# Patient Record
Sex: Male | Born: 1935 | Marital: Single
Health system: Southern US, Community
[De-identification: ages and names within clinical notes are randomized; demographics above are authoritative.]

---

## 2006-02-23 ENCOUNTER — Other Ambulatory Visit: Payer: Self-pay

## 2006-02-23 ENCOUNTER — Inpatient Hospital Stay: Payer: Self-pay | Admitting: Internal Medicine

## 2006-06-12 ENCOUNTER — Ambulatory Visit: Payer: Self-pay | Admitting: Internal Medicine

## 2006-06-18 ENCOUNTER — Ambulatory Visit: Payer: Self-pay | Admitting: Internal Medicine

## 2006-06-23 ENCOUNTER — Ambulatory Visit: Payer: Self-pay | Admitting: Internal Medicine

## 2007-01-01 ENCOUNTER — Encounter: Payer: Self-pay | Admitting: Internal Medicine

## 2007-02-02 ENCOUNTER — Ambulatory Visit: Payer: Self-pay | Admitting: Vascular Surgery

## 2007-02-13 ENCOUNTER — Encounter: Payer: Self-pay | Admitting: Internal Medicine

## 2007-02-17 ENCOUNTER — Encounter: Payer: Self-pay | Admitting: Internal Medicine

## 2007-03-20 ENCOUNTER — Encounter: Payer: Self-pay | Admitting: Internal Medicine

## 2007-04-18 ENCOUNTER — Ambulatory Visit: Payer: Self-pay | Admitting: Internal Medicine

## 2007-04-20 ENCOUNTER — Encounter: Payer: Self-pay | Admitting: Internal Medicine

## 2009-04-19 ENCOUNTER — Ambulatory Visit: Payer: Self-pay | Admitting: Urology

## 2011-08-19 ENCOUNTER — Inpatient Hospital Stay: Payer: Self-pay | Admitting: Internal Medicine

## 2011-08-20 LAB — CBC WITH DIFFERENTIAL/PLATELET
Basophil #: 0 10*3/uL (ref 0.0–0.1)
Basophil %: 0 %
Eosinophil %: 0.4 %
HCT: 30.4 % — ABNORMAL LOW (ref 40.0–52.0)
Lymphocyte #: 2.1 10*3/uL (ref 1.0–3.6)
Lymphocyte %: 10.3 %
MCH: 28.5 pg (ref 26.0–34.0)
MCV: 88 fL (ref 80–100)
Monocyte %: 7.2 %
Neutrophil #: 16.7 10*3/uL — ABNORMAL HIGH (ref 1.4–6.5)
Neutrophil %: 82.1 %
RBC: 3.46 10*6/uL — ABNORMAL LOW (ref 4.40–5.90)
RDW: 17.3 % — ABNORMAL HIGH (ref 11.5–14.5)
WBC: 20.3 10*3/uL — ABNORMAL HIGH (ref 3.8–10.6)

## 2011-08-20 LAB — LIPID PANEL
Cholesterol: 146 mg/dL (ref 0–200)
HDL Cholesterol: 63 mg/dL — ABNORMAL HIGH (ref 40–60)
Ldl Cholesterol, Calc: 61 mg/dL (ref 0–100)
VLDL Cholesterol, Calc: 22 mg/dL (ref 5–40)

## 2011-08-20 LAB — BASIC METABOLIC PANEL
Anion Gap: 10 (ref 7–16)
Chloride: 99 mmol/L (ref 98–107)
Co2: 28 mmol/L (ref 21–32)
Creatinine: 1.46 mg/dL — ABNORMAL HIGH (ref 0.60–1.30)
EGFR (Non-African Amer.): 50 — ABNORMAL LOW

## 2011-08-21 LAB — CBC WITH DIFFERENTIAL/PLATELET
Basophil %: 0 %
Eosinophil #: 0.2 10*3/uL (ref 0.0–0.7)
Eosinophil %: 1.5 %
HGB: 10.3 g/dL — ABNORMAL LOW (ref 13.0–18.0)
Lymphocyte %: 10.4 %
MCHC: 32.9 g/dL (ref 32.0–36.0)
Monocyte %: 5.3 %
Neutrophil #: 11.9 10*3/uL — ABNORMAL HIGH (ref 1.4–6.5)
Neutrophil %: 82.8 %
Platelet: 179 10*3/uL (ref 150–440)
RBC: 3.59 10*6/uL — ABNORMAL LOW (ref 4.40–5.90)

## 2011-08-21 LAB — BASIC METABOLIC PANEL
Anion Gap: 10 (ref 7–16)
BUN: 19 mg/dL — ABNORMAL HIGH (ref 7–18)
Calcium, Total: 8.6 mg/dL (ref 8.5–10.1)
Chloride: 102 mmol/L (ref 98–107)
Co2: 26 mmol/L (ref 21–32)
EGFR (African American): >60

## 2011-08-22 LAB — CREATININE, SERUM
EGFR (African American): >60
EGFR (Non-African Amer.): >60

## 2011-08-22 LAB — PROTIME-INR
INR: 1.2
Prothrombin Time: 15.2 secs — ABNORMAL HIGH (ref 11.5–14.7)

## 2012-07-23 ENCOUNTER — Ambulatory Visit: Payer: Self-pay | Admitting: Unknown Physician Specialty

## 2012-08-15 ENCOUNTER — Other Ambulatory Visit: Payer: Self-pay | Admitting: Internal Medicine

## 2012-12-05 ENCOUNTER — Ambulatory Visit: Payer: Self-pay | Admitting: Internal Medicine

## 2012-12-05 LAB — CBC WITH DIFFERENTIAL/PLATELET
Basophil #: 0.1 10*3/uL (ref 0.0–0.1)
HCT: 36.2 % — ABNORMAL LOW (ref 40.0–52.0)
MCH: 27.5 pg (ref 26.0–34.0)
MCHC: 32.2 g/dL (ref 32.0–36.0)
MCV: 86 fL (ref 80–100)
Monocyte #: 1.2 x10 3/mm — ABNORMAL HIGH (ref 0.2–1.0)
Monocyte %: 5.6 %
Neutrophil #: 18 10*3/uL — ABNORMAL HIGH (ref 1.4–6.5)
RBC: 4.24 10*6/uL — ABNORMAL LOW (ref 4.40–5.90)
RDW: 17.9 % — ABNORMAL HIGH (ref 11.5–14.5)

## 2012-12-05 LAB — COMPREHENSIVE METABOLIC PANEL
Albumin: 3 g/dL — ABNORMAL LOW (ref 3.4–5.0)
BUN: 21 mg/dL — ABNORMAL HIGH (ref 7–18)
Bilirubin,Total: 0.3 mg/dL (ref 0.2–1.0)
Calcium, Total: 9.5 mg/dL (ref 8.5–10.1)
Chloride: 101 mmol/L (ref 98–107)
Co2: 29 mmol/L (ref 21–32)
Creatinine: 1.18 mg/dL (ref 0.60–1.30)
EGFR (African American): >60
Glucose: 217 mg/dL — ABNORMAL HIGH (ref 65–99)
Potassium: 4.5 mmol/L (ref 3.5–5.1)
SGOT(AST): 23 U/L (ref 15–37)
SGPT (ALT): 24 U/L (ref 12–78)
Sodium: 136 mmol/L (ref 136–145)

## 2013-09-08 ENCOUNTER — Inpatient Hospital Stay: Payer: Self-pay | Admitting: Internal Medicine

## 2013-09-08 LAB — CBC WITH DIFFERENTIAL/PLATELET
BASOS ABS: 0.1 10*3/uL (ref 0.0–0.1)
Basophil %: 0.4 %
Eosinophil #: 0.1 10*3/uL (ref 0.0–0.7)
Eosinophil %: 0.2 %
HCT: 32.9 % — AB (ref 40.0–52.0)
HGB: 10.6 g/dL — AB (ref 13.0–18.0)
Lymphocyte #: 2 10*3/uL (ref 1.0–3.6)
Lymphocyte %: 6.9 %
MCH: 29.7 pg (ref 26.0–34.0)
MCHC: 32.3 g/dL (ref 32.0–36.0)
MCV: 92 fL (ref 80–100)
MONOS PCT: 6.8 %
Monocyte #: 1.9 x10 3/mm — ABNORMAL HIGH (ref 0.2–1.0)
Neutrophil #: 24.6 10*3/uL — ABNORMAL HIGH (ref 1.4–6.5)
Neutrophil %: 85.7 %
Platelet: 161 10*3/uL (ref 150–440)
RBC: 3.59 10*6/uL — ABNORMAL LOW (ref 4.40–5.90)
RDW: 15.6 % — ABNORMAL HIGH (ref 11.5–14.5)
WBC: 28.7 10*3/uL — ABNORMAL HIGH (ref 3.8–10.6)

## 2013-09-08 LAB — URINALYSIS, COMPLETE
Bilirubin,UR: NEGATIVE
Glucose,UR: NEGATIVE mg/dL (ref 0–75)
Nitrite: NEGATIVE
PH: 5 (ref 4.5–8.0)
Protein: 30
RBC,UR: 20 /HPF (ref 0–5)
SPECIFIC GRAVITY: 1.019 (ref 1.003–1.030)
WBC UR: 108 /HPF (ref 0–5)

## 2013-09-08 LAB — TROPONIN I
Troponin-I: 0.9 ng/mL — ABNORMAL HIGH
Troponin-I: 0.09 ng/mL — ABNORMAL HIGH

## 2013-09-08 LAB — CK-MB
CK-MB: 0.8 ng/mL (ref 0.5–3.6)
CK-MB: 3.2 ng/mL (ref 0.5–3.6)

## 2013-09-08 LAB — COMPREHENSIVE METABOLIC PANEL
ALBUMIN: 1.8 g/dL — AB (ref 3.4–5.0)
ALT: 22 U/L (ref 12–78)
Alkaline Phosphatase: 154 U/L — ABNORMAL HIGH
Anion Gap: 12 (ref 7–16)
BUN: 90 mg/dL — ABNORMAL HIGH (ref 7–18)
Bilirubin,Total: 0.5 mg/dL (ref 0.2–1.0)
CALCIUM: 8.4 mg/dL — AB (ref 8.5–10.1)
CHLORIDE: 105 mmol/L (ref 98–107)
Co2: 21 mmol/L (ref 21–32)
Creatinine: 6.05 mg/dL — ABNORMAL HIGH (ref 0.60–1.30)
GFR CALC AF AMER: 10 — AB
GFR CALC NON AF AMER: 8 — AB
GLUCOSE: 124 mg/dL — AB (ref 65–99)
OSMOLALITY: 305 (ref 275–301)
Potassium: 4.4 mmol/L (ref 3.5–5.1)
SGOT(AST): 27 U/L (ref 15–37)
Sodium: 138 mmol/L (ref 136–145)
Total Protein: 6.9 g/dL (ref 6.4–8.2)

## 2013-09-08 LAB — PROTIME-INR
INR: 1.3
Prothrombin Time: 15.6 secs — ABNORMAL HIGH (ref 11.5–14.7)

## 2013-09-08 LAB — MAGNESIUM: Magnesium: 2.8 mg/dL — ABNORMAL HIGH

## 2013-09-08 LAB — LIPASE, BLOOD: Lipase: 90 U/L (ref 73–393)

## 2013-09-08 LAB — APTT: Activated PTT: 28.8 secs (ref 23.6–35.9)

## 2013-09-09 DIAGNOSIS — E785 Hyperlipidemia, unspecified: Secondary | ICD-10-CM

## 2013-09-09 DIAGNOSIS — R7989 Other specified abnormal findings of blood chemistry: Secondary | ICD-10-CM

## 2013-09-09 DIAGNOSIS — R6520 Severe sepsis without septic shock: Secondary | ICD-10-CM

## 2013-09-09 DIAGNOSIS — A419 Sepsis, unspecified organism: Secondary | ICD-10-CM

## 2013-09-09 DIAGNOSIS — N179 Acute kidney failure, unspecified: Secondary | ICD-10-CM

## 2013-09-09 DIAGNOSIS — I10 Essential (primary) hypertension: Secondary | ICD-10-CM

## 2013-09-09 LAB — PHENYTOIN LEVEL, TOTAL: Dilantin: 12.5 ug/mL (ref 10.0–20.0)

## 2013-09-09 LAB — COMPREHENSIVE METABOLIC PANEL
ALT: 17 U/L (ref 12–78)
AST: 22 U/L (ref 15–37)
Albumin: 1.8 g/dL — ABNORMAL LOW (ref 3.4–5.0)
Alkaline Phosphatase: 126 U/L — ABNORMAL HIGH
Anion Gap: 9 (ref 7–16)
BILIRUBIN TOTAL: 0.4 mg/dL (ref 0.2–1.0)
BUN: 80 mg/dL — AB (ref 7–18)
CO2: 21 mmol/L (ref 21–32)
CREATININE: 5.11 mg/dL — AB (ref 0.60–1.30)
Calcium, Total: 7.7 mg/dL — ABNORMAL LOW (ref 8.5–10.1)
Chloride: 109 mmol/L — ABNORMAL HIGH (ref 98–107)
GFR CALC AF AMER: 12 — AB
GFR CALC NON AF AMER: 10 — AB
GLUCOSE: 154 mg/dL — AB (ref 65–99)
OSMOLALITY: 305 (ref 275–301)
Potassium: 3.5 mmol/L (ref 3.5–5.1)
SODIUM: 139 mmol/L (ref 136–145)
TOTAL PROTEIN: 6.5 g/dL (ref 6.4–8.2)

## 2013-09-09 LAB — CBC WITH DIFFERENTIAL/PLATELET
Basophil #: 0.1 10*3/uL (ref 0.0–0.1)
Basophil %: 0.4 %
EOS ABS: 0.2 10*3/uL (ref 0.0–0.7)
EOS PCT: 0.8 %
HCT: 33.5 % — AB (ref 40.0–52.0)
HGB: 10.8 g/dL — ABNORMAL LOW (ref 13.0–18.0)
LYMPHS ABS: 2 10*3/uL (ref 1.0–3.6)
LYMPHS PCT: 8.7 %
MCH: 29.8 pg (ref 26.0–34.0)
MCHC: 32.1 g/dL (ref 32.0–36.0)
MCV: 93 fL (ref 80–100)
MONOS PCT: 7.2 %
Monocyte #: 1.6 x10 3/mm — ABNORMAL HIGH (ref 0.2–1.0)
Neutrophil #: 18.8 10*3/uL — ABNORMAL HIGH (ref 1.4–6.5)
Neutrophil %: 82.9 %
Platelet: 162 10*3/uL (ref 150–440)
RBC: 3.62 10*6/uL — ABNORMAL LOW (ref 4.40–5.90)
RDW: 16.4 % — AB (ref 11.5–14.5)
WBC: 22.7 10*3/uL — AB (ref 3.8–10.6)

## 2013-09-09 LAB — HEMOGLOBIN A1C: HEMOGLOBIN A1C: 9.1 % — AB (ref 4.2–6.3)

## 2013-09-09 LAB — MAGNESIUM: Magnesium: 2.2 mg/dL

## 2013-09-09 LAB — TROPONIN I: Troponin-I: 0.11 ng/mL — ABNORMAL HIGH

## 2013-09-09 LAB — CK-MB: CK-MB: 3.9 ng/mL — ABNORMAL HIGH (ref 0.5–3.6)

## 2013-09-09 LAB — VANCOMYCIN, RANDOM: Vancomycin, Random: 4 ug/mL

## 2013-09-09 LAB — TSH: Thyroid Stimulating Horm: 2.28 u[IU]/mL

## 2013-09-10 DIAGNOSIS — I219 Acute myocardial infarction, unspecified: Secondary | ICD-10-CM

## 2013-09-10 LAB — CBC WITH DIFFERENTIAL/PLATELET
Basophil #: 0 10*3/uL (ref 0.0–0.1)
Basophil %: 0.1 %
EOS PCT: 0.1 %
Eosinophil #: 0 10*3/uL (ref 0.0–0.7)
HCT: 32 % — ABNORMAL LOW (ref 40.0–52.0)
HGB: 10.1 g/dL — ABNORMAL LOW (ref 13.0–18.0)
LYMPHS ABS: 1.7 10*3/uL (ref 1.0–3.6)
LYMPHS PCT: 5 %
MCH: 29.6 pg (ref 26.0–34.0)
MCHC: 31.7 g/dL — ABNORMAL LOW (ref 32.0–36.0)
MCV: 93 fL (ref 80–100)
MONO ABS: 2 x10 3/mm — AB (ref 0.2–1.0)
MONOS PCT: 6 %
Neutrophil #: 30 10*3/uL — ABNORMAL HIGH (ref 1.4–6.5)
Neutrophil %: 88.8 %
PLATELETS: 140 10*3/uL — AB (ref 150–440)
RBC: 3.43 10*6/uL — ABNORMAL LOW (ref 4.40–5.90)
RDW: 17 % — ABNORMAL HIGH (ref 11.5–14.5)
WBC: 33.8 10*3/uL — AB (ref 3.8–10.6)

## 2013-09-10 LAB — BASIC METABOLIC PANEL
ANION GAP: 10 (ref 7–16)
Anion Gap: 15 (ref 7–16)
BUN: 77 mg/dL — AB (ref 7–18)
BUN: 75 mg/dL — ABNORMAL HIGH (ref 7–18)
CALCIUM: 7.5 mg/dL — AB (ref 8.5–10.1)
CALCIUM: 7.4 mg/dL — AB (ref 8.5–10.1)
CHLORIDE: 108 mmol/L — AB (ref 98–107)
CREATININE: 5.39 mg/dL — AB (ref 0.60–1.30)
Chloride: 111 mmol/L — ABNORMAL HIGH (ref 98–107)
Co2: 15 mmol/L — ABNORMAL LOW (ref 21–32)
Co2: 19 mmol/L — ABNORMAL LOW (ref 21–32)
Creatinine: 5.35 mg/dL — ABNORMAL HIGH (ref 0.60–1.30)
EGFR (African American): 11 — ABNORMAL LOW
EGFR (African American): 11 — ABNORMAL LOW
EGFR (Non-African Amer.): 9 — ABNORMAL LOW
GFR CALC NON AF AMER: 10 — AB
Glucose: 357 mg/dL — ABNORMAL HIGH (ref 65–99)
Glucose: 285 mg/dL — ABNORMAL HIGH (ref 65–99)
OSMOLALITY: 313 (ref 275–301)
OSMOLALITY: 312 (ref 275–301)
Potassium: 3.8 mmol/L (ref 3.5–5.1)
Potassium: 3.4 mmol/L — ABNORMAL LOW (ref 3.5–5.1)
SODIUM: 138 mmol/L (ref 136–145)
Sodium: 140 mmol/L (ref 136–145)

## 2013-09-10 LAB — PHENYTOIN LEVEL, TOTAL: DILANTIN: 11.2 ug/mL (ref 10.0–20.0)

## 2013-09-10 LAB — URINE CULTURE

## 2013-09-10 LAB — MAGNESIUM: MAGNESIUM: 2.2 mg/dL

## 2013-09-11 ENCOUNTER — Ambulatory Visit: Payer: Self-pay | Admitting: Urology

## 2013-09-11 LAB — CBC WITH DIFFERENTIAL/PLATELET
BASOS ABS: 0.1 10*3/uL (ref 0.0–0.1)
BASOS PCT: 0.2 %
EOS ABS: 0.2 10*3/uL (ref 0.0–0.7)
EOS PCT: 0.7 %
HCT: 30.2 % — ABNORMAL LOW (ref 40.0–52.0)
HGB: 9.7 g/dL — ABNORMAL LOW (ref 13.0–18.0)
Lymphocyte #: 1.4 10*3/uL (ref 1.0–3.6)
Lymphocyte %: 3.9 %
MCH: 29.3 pg (ref 26.0–34.0)
MCHC: 32 g/dL (ref 32.0–36.0)
MCV: 92 fL (ref 80–100)
MONOS PCT: 4.8 %
Monocyte #: 1.7 x10 3/mm — ABNORMAL HIGH (ref 0.2–1.0)
NEUTROS PCT: 90.4 %
Neutrophil #: 32.2 10*3/uL — ABNORMAL HIGH (ref 1.4–6.5)
PLATELETS: 132 10*3/uL — AB (ref 150–440)
RBC: 3.31 10*6/uL — ABNORMAL LOW (ref 4.40–5.90)
RDW: 17 % — AB (ref 11.5–14.5)
WBC: 35.5 10*3/uL — ABNORMAL HIGH (ref 3.8–10.6)

## 2013-09-11 LAB — BASIC METABOLIC PANEL
Anion Gap: 10 (ref 7–16)
BUN: 72 mg/dL — AB (ref 7–18)
CALCIUM: 7.2 mg/dL — AB (ref 8.5–10.1)
CO2: 18 mmol/L — AB (ref 21–32)
Chloride: 112 mmol/L — ABNORMAL HIGH (ref 98–107)
Creatinine: 5.27 mg/dL — ABNORMAL HIGH (ref 0.60–1.30)
EGFR (African American): 11 — ABNORMAL LOW
GFR CALC NON AF AMER: 10 — AB
GLUCOSE: 195 mg/dL — AB (ref 65–99)
OSMOLALITY: 306 (ref 275–301)
POTASSIUM: 3.7 mmol/L (ref 3.5–5.1)
SODIUM: 140 mmol/L (ref 136–145)

## 2013-09-11 LAB — MAGNESIUM: Magnesium: 1.8 mg/dL

## 2013-09-12 LAB — BASIC METABOLIC PANEL
ANION GAP: 14 (ref 7–16)
BUN: 66 mg/dL — ABNORMAL HIGH (ref 7–18)
CALCIUM: 7.5 mg/dL — AB (ref 8.5–10.1)
Chloride: 118 mmol/L — ABNORMAL HIGH (ref 98–107)
Co2: 13 mmol/L — ABNORMAL LOW (ref 21–32)
Creatinine: 4.52 mg/dL — ABNORMAL HIGH (ref 0.60–1.30)
EGFR (African American): 14 — ABNORMAL LOW
EGFR (Non-African Amer.): 12 — ABNORMAL LOW
GLUCOSE: 172 mg/dL — AB (ref 65–99)
OSMOLALITY: 312 (ref 275–301)
POTASSIUM: 3.5 mmol/L (ref 3.5–5.1)
SODIUM: 145 mmol/L (ref 136–145)

## 2013-09-12 LAB — WBC: WBC: 19.1 10*3/uL — ABNORMAL HIGH (ref 3.8–10.6)

## 2013-09-13 ENCOUNTER — Encounter: Payer: Self-pay | Admitting: Cardiovascular Disease

## 2013-09-13 LAB — BASIC METABOLIC PANEL
ANION GAP: 13 (ref 7–16)
BUN: 55 mg/dL — AB (ref 7–18)
Calcium, Total: 7.2 mg/dL — ABNORMAL LOW (ref 8.5–10.1)
Chloride: 113 mmol/L — ABNORMAL HIGH (ref 98–107)
Co2: 20 mmol/L — ABNORMAL LOW (ref 21–32)
Creatinine: 3.67 mg/dL — ABNORMAL HIGH (ref 0.60–1.30)
EGFR (African American): 18 — ABNORMAL LOW
GFR CALC NON AF AMER: 15 — AB
Glucose: 236 mg/dL — ABNORMAL HIGH (ref 65–99)
Osmolality: 313 (ref 275–301)
Potassium: 3 mmol/L — ABNORMAL LOW (ref 3.5–5.1)
SODIUM: 146 mmol/L — AB (ref 136–145)

## 2013-09-13 LAB — CULTURE, BLOOD (SINGLE)

## 2013-09-14 LAB — BASIC METABOLIC PANEL
ANION GAP: 9 (ref 7–16)
BUN: 42 mg/dL — ABNORMAL HIGH (ref 7–18)
CHLORIDE: 109 mmol/L — AB (ref 98–107)
Calcium, Total: 7.2 mg/dL — ABNORMAL LOW (ref 8.5–10.1)
Co2: 28 mmol/L (ref 21–32)
Creatinine: 3.12 mg/dL — ABNORMAL HIGH (ref 0.60–1.30)
EGFR (Non-African Amer.): 18 — ABNORMAL LOW
GFR CALC AF AMER: 21 — AB
GLUCOSE: 266 mg/dL — AB (ref 65–99)
Osmolality: 310 (ref 275–301)
POTASSIUM: 2.7 mmol/L — AB (ref 3.5–5.1)
Sodium: 146 mmol/L — ABNORMAL HIGH (ref 136–145)

## 2013-09-14 LAB — CREATININE, SERUM
CREATININE: 3.26 mg/dL — AB (ref 0.60–1.30)
EGFR (African American): 20 — ABNORMAL LOW
EGFR (Non-African Amer.): 17 — ABNORMAL LOW

## 2013-09-14 LAB — PLATELET COUNT: Platelet: 197 10*3/uL (ref 150–440)

## 2013-09-15 LAB — POTASSIUM: POTASSIUM: 3.3 mmol/L — AB (ref 3.5–5.1)

## 2013-09-16 LAB — BODY FLUID CULTURE

## 2013-09-18 ENCOUNTER — Inpatient Hospital Stay: Payer: Self-pay | Admitting: Family Medicine

## 2013-09-18 LAB — URINALYSIS, COMPLETE
Bilirubin,UR: NEGATIVE
Glucose,UR: 50 mg/dL (ref 0–75)
KETONE: NEGATIVE
Nitrite: NEGATIVE
Ph: 8 (ref 4.5–8.0)
Protein: 100
RBC,UR: 5045 /HPF (ref 0–5)
SPECIFIC GRAVITY: 1.013 (ref 1.003–1.030)
SQUAMOUS EPITHELIAL: NONE SEEN
WBC UR: 121 /HPF (ref 0–5)

## 2013-09-18 LAB — OCCULT BLOOD X 1 CARD TO LAB, STOOL: Occult Blood, Feces: POSITIVE

## 2013-09-18 LAB — COMPREHENSIVE METABOLIC PANEL
ALBUMIN: 1.7 g/dL — AB (ref 3.4–5.0)
ALK PHOS: 149 U/L — AB
ANION GAP: 6 — AB (ref 7–16)
BUN: 36 mg/dL — AB (ref 7–18)
Bilirubin,Total: 0.2 mg/dL (ref 0.2–1.0)
CALCIUM: 7.9 mg/dL — AB (ref 8.5–10.1)
CHLORIDE: 121 mmol/L — AB (ref 98–107)
Co2: 28 mmol/L (ref 21–32)
Creatinine: 2.52 mg/dL — ABNORMAL HIGH (ref 0.60–1.30)
EGFR (African American): 28 — ABNORMAL LOW
EGFR (Non-African Amer.): 24 — ABNORMAL LOW
GLUCOSE: 221 mg/dL — AB (ref 65–99)
OSMOLALITY: 322 (ref 275–301)
Potassium: 3.3 mmol/L — ABNORMAL LOW (ref 3.5–5.1)
SGOT(AST): 22 U/L (ref 15–37)
SGPT (ALT): 16 U/L (ref 12–78)
SODIUM: 155 mmol/L — AB (ref 136–145)
TOTAL PROTEIN: 7 g/dL (ref 6.4–8.2)

## 2013-09-18 LAB — CBC
HCT: 33.7 % — ABNORMAL LOW (ref 40.0–52.0)
HGB: 10.9 g/dL — AB (ref 13.0–18.0)
MCH: 29.6 pg (ref 26.0–34.0)
MCHC: 32.5 g/dL (ref 32.0–36.0)
MCV: 91 fL (ref 80–100)
Platelet: 279 10*3/uL (ref 150–440)
RBC: 3.69 10*6/uL — ABNORMAL LOW (ref 4.40–5.90)
RDW: 17.1 % — AB (ref 11.5–14.5)
WBC: 22.9 10*3/uL — ABNORMAL HIGH (ref 3.8–10.6)

## 2013-09-18 LAB — APTT: Activated PTT: 39.7 secs — ABNORMAL HIGH (ref 23.6–35.9)

## 2013-09-18 LAB — TROPONIN I: Troponin-I: <0.02 ng/mL

## 2013-09-18 LAB — PHENYTOIN LEVEL, TOTAL: DILANTIN: 21.8 ug/mL — AB (ref 10.0–20.0)

## 2013-09-18 LAB — PROTIME-INR
INR: 1.2
PROTHROMBIN TIME: 15.2 s — AB (ref 11.5–14.7)

## 2013-09-18 LAB — CLOSTRIDIUM DIFFICILE(ARMC)

## 2013-09-18 LAB — PHENOBARBITAL LEVEL: PHENOBARBITAL: 24.2 ug/mL (ref 15.0–40.0)

## 2013-09-19 LAB — TSH: Thyroid Stimulating Horm: 7 u[IU]/mL — ABNORMAL HIGH

## 2013-09-19 LAB — WBCS, STOOL

## 2013-09-19 LAB — CBC WITH DIFFERENTIAL/PLATELET
Basophil #: 0.1 x10 3/mm 3
Basophil %: 0.6 %
Eosinophil #: 0.2 x10 3/mm 3
Eosinophil %: 1.8 %
HCT: 30 % — ABNORMAL LOW
HGB: 9.8 g/dL — ABNORMAL LOW
Lymphocyte %: 16.6 %
Lymphs Abs: 2 x10 3/mm 3
MCH: 29.8 pg
MCHC: 32.8 g/dL
MCV: 91 fL
Monocyte #: 0.7 "x10 3/mm "
Monocyte %: 5.4 %
Neutrophil #: 9.2 x10 3/mm 3 — ABNORMAL HIGH
Neutrophil %: 75.6 %
Platelet: 256 x10 3/mm 3
RBC: 3.3 x10 6/mm 3 — ABNORMAL LOW
RDW: 17 % — ABNORMAL HIGH
WBC: 12.2 x10 3/mm 3 — ABNORMAL HIGH

## 2013-09-19 LAB — BASIC METABOLIC PANEL
Anion Gap: 5 — ABNORMAL LOW (ref 7–16)
BUN: 30 mg/dL — ABNORMAL HIGH (ref 7–18)
CHLORIDE: 122 mmol/L — AB (ref 98–107)
CO2: 29 mmol/L (ref 21–32)
Calcium, Total: 7.7 mg/dL — ABNORMAL LOW (ref 8.5–10.1)
Creatinine: 2.36 mg/dL — ABNORMAL HIGH (ref 0.60–1.30)
GFR CALC AF AMER: 30 — AB
GFR CALC NON AF AMER: 26 — AB
Glucose: 110 mg/dL — ABNORMAL HIGH (ref 65–99)
Osmolality: 316 (ref 275–301)
Potassium: 3.2 mmol/L — ABNORMAL LOW (ref 3.5–5.1)
SODIUM: 156 mmol/L — AB (ref 136–145)

## 2013-09-20 LAB — CBC WITH DIFFERENTIAL/PLATELET
Basophil #: 0.1 10*3/uL (ref 0.0–0.1)
Basophil %: 0.5 %
Eosinophil #: 0.2 10*3/uL (ref 0.0–0.7)
Eosinophil %: 1.5 %
HCT: 27.6 % — AB (ref 40.0–52.0)
HGB: 8.9 g/dL — AB (ref 13.0–18.0)
LYMPHS PCT: 17.2 %
Lymphocyte #: 2 10*3/uL (ref 1.0–3.6)
MCH: 29.7 pg (ref 26.0–34.0)
MCHC: 32.2 g/dL (ref 32.0–36.0)
MCV: 92 fL (ref 80–100)
MONOS PCT: 6.1 %
Monocyte #: 0.7 x10 3/mm (ref 0.2–1.0)
NEUTROS ABS: 8.7 10*3/uL — AB (ref 1.4–6.5)
NEUTROS PCT: 74.7 %
Platelet: 252 10*3/uL (ref 150–440)
RBC: 2.99 10*6/uL — AB (ref 4.40–5.90)
RDW: 17.2 % — AB (ref 11.5–14.5)
WBC: 11.7 10*3/uL — ABNORMAL HIGH (ref 3.8–10.6)

## 2013-09-20 LAB — BASIC METABOLIC PANEL
BUN: 25 mg/dL — ABNORMAL HIGH (ref 7–18)
CALCIUM: 7.9 mg/dL — AB (ref 8.5–10.1)
CREATININE: 2.43 mg/dL — AB (ref 0.60–1.30)
Co2: 26 mmol/L (ref 21–32)
EGFR (Non-African Amer.): 25 — ABNORMAL LOW
GFR CALC AF AMER: 29 — AB
Glucose: 166 mg/dL — ABNORMAL HIGH (ref 65–99)
Potassium: 4.1 mmol/L (ref 3.5–5.1)
Sodium: 157 mmol/L — ABNORMAL HIGH (ref 136–145)

## 2013-09-20 LAB — MAGNESIUM: Magnesium: 1.4 mg/dL — ABNORMAL LOW

## 2013-09-20 LAB — URINE CULTURE

## 2013-09-21 LAB — BASIC METABOLIC PANEL
BUN: 23 mg/dL — ABNORMAL HIGH (ref 7–18)
CALCIUM: 8.2 mg/dL — AB (ref 8.5–10.1)
Chloride: >128 mmol/L — ABNORMAL HIGH (ref 98–107)
Co2: 23 mmol/L (ref 21–32)
Creatinine: 2.28 mg/dL — ABNORMAL HIGH (ref 0.60–1.30)
EGFR (African American): 31 — ABNORMAL LOW
EGFR (Non-African Amer.): 27 — ABNORMAL LOW
GLUCOSE: 201 mg/dL — AB (ref 65–99)
POTASSIUM: 4.6 mmol/L (ref 3.5–5.1)

## 2013-09-21 LAB — CBC WITH DIFFERENTIAL/PLATELET
BASOS PCT: 0.6 %
Basophil #: 0.1 10*3/uL (ref 0.0–0.1)
EOS ABS: 0.2 10*3/uL (ref 0.0–0.7)
Eosinophil %: 1.1 %
HCT: 29.3 % — AB (ref 40.0–52.0)
HGB: 9.2 g/dL — ABNORMAL LOW (ref 13.0–18.0)
Lymphocyte #: 2.1 10*3/uL (ref 1.0–3.6)
Lymphocyte %: 14.1 %
MCH: 29 pg (ref 26.0–34.0)
MCHC: 31.3 g/dL — AB (ref 32.0–36.0)
MCV: 93 fL (ref 80–100)
MONO ABS: 0.8 x10 3/mm (ref 0.2–1.0)
MONOS PCT: 5 %
NEUTROS ABS: 12 10*3/uL — AB (ref 1.4–6.5)
NEUTROS PCT: 79.2 %
Platelet: 282 10*3/uL (ref 150–440)
RBC: 3.16 10*6/uL — AB (ref 4.40–5.90)
RDW: 17.5 % — ABNORMAL HIGH (ref 11.5–14.5)
WBC: 15.2 10*3/uL — AB (ref 3.8–10.6)

## 2013-09-21 LAB — SODIUM: Sodium: 160 mmol/L (ref 136–145)

## 2013-09-22 LAB — BASIC METABOLIC PANEL
BUN: 17 mg/dL (ref 7–18)
Calcium, Total: 8.1 mg/dL — ABNORMAL LOW (ref 8.5–10.1)
Chloride: >128 mmol/L — ABNORMAL HIGH (ref 98–107)
Co2: 24 mmol/L (ref 21–32)
Creatinine: 2.17 mg/dL — ABNORMAL HIGH (ref 0.60–1.30)
EGFR (African American): 33 — ABNORMAL LOW
GFR CALC NON AF AMER: 29 — AB
GLUCOSE: 238 mg/dL — AB (ref 65–99)
Osmolality: 318 (ref 275–301)
Potassium: 4.3 mmol/L (ref 3.5–5.1)
Sodium: 156 mmol/L — ABNORMAL HIGH (ref 136–145)

## 2013-09-22 LAB — CBC WITH DIFFERENTIAL/PLATELET
BASOS PCT: 0.6 %
Basophil #: 0.1 10*3/uL (ref 0.0–0.1)
Eosinophil #: 0.2 10*3/uL (ref 0.0–0.7)
Eosinophil %: 2 %
HCT: 26.4 % — AB (ref 40.0–52.0)
HGB: 8.7 g/dL — AB (ref 13.0–18.0)
LYMPHS ABS: 1.8 10*3/uL (ref 1.0–3.6)
Lymphocyte %: 16.6 %
MCH: 30.4 pg (ref 26.0–34.0)
MCHC: 32.8 g/dL (ref 32.0–36.0)
MCV: 93 fL (ref 80–100)
MONOS PCT: 4.8 %
Monocyte #: 0.5 x10 3/mm (ref 0.2–1.0)
Neutrophil #: 8.1 10*3/uL — ABNORMAL HIGH (ref 1.4–6.5)
Neutrophil %: 76 %
PLATELETS: 239 10*3/uL (ref 150–440)
RBC: 2.85 10*6/uL — AB (ref 4.40–5.90)
RDW: 17.7 % — AB (ref 11.5–14.5)
WBC: 10.6 10*3/uL (ref 3.8–10.6)

## 2013-09-22 LAB — STOOL CULTURE

## 2013-09-23 LAB — CULTURE, BLOOD (SINGLE)

## 2013-10-17 DEATH — deceased

## 2014-12-10 NOTE — H&P (Signed)
PATIENT NAME:  Jason Terrell, FOTI MR#:  [id number] DATE OF BIRTH:  [birth year]  DATE OF ADMISSION:  09/18/2013  PRIMARY CARE PROVIDER:  Dr. Brynda Greathouse.   EMERGENCY DEPARTMENT REFERRING PHYSICIAN:  Dr. Benjaman Lobe.   CHIEF COMPLAINT:  Hypoglycemia, lethargy.   HISTORY OF PRESENT ILLNESS:  The patient is a 80 year old African American male with multiple medical problems, who was recently hospitalized from 09/08/2013 to 01/28. At that time, the patient was admitted with septic shock. The patient was admitted to the ICU, was started on IV fluids, IV Levophed. Where he was noted to have pyelonephritis secondary to proximal ureteral obstruction uropathy. The patient was noted to have ESBL producing E. coli. He was treated with IV Invanz and was discharged to the facility with 3 more days of IM Invanz. He also during the hospitalization had a left-sided nephrostomy tube placed and was supposed to follow up with Dr. Bernardo Heater as an outpatient. He was at a skilled nursing facility at [facility] where he was noted to have hypoglycemia. The patient has not been eating or drinking much and very has been lethargic according to his niece, who is at the bedside. The patient himself is a very poor historian and is unable to give me any history. He was evaluated in the ED and was noted to have a WBC count of 22.9. His WBC last known on January 25 was 19.1.   PAST MEDICAL HISTORY: Significant for:  1.  Recent admission for sepsis due to ESBL UTI with associated left-sided pyelonephritis and a renal stone.  2.  Metabolic encephalopathy due to sepsis.  3.  Anemia of chronic disease.  4.  Chronic seizure disorder.  5.  Dysphagia, status post speech evaluation during recent hospitalization.  6.  Hypertension.  7.  Diabetes.  8.  BPH.  9.  A history of group B streptococcal sepsis.  10.  A history of right lower extremity cellulitis.   11.  A history of aspiration pneumonitis in the past.  12.  Acute renal failure felt to  be due to ATN during recent hospitalization.  13.  Anxiety disorder.  14.  Depression.  15.  Morbid obesity.   PAST SURGICAL HISTORY:  A history of aortogram and left-sided nephrostomy tube placement recently.   ALLERGIES:  None.   CURRENT MEDICATIONS:  As per the skilled nursing facility:  1.  Tylenol 650 q.4 p.r.n. for pain or fever.  2.  Aspirin 325 daily.  3.  Duloxetine 60 daily.  4.  DuoNebs q.4 p.r.n.  5.  Lamotrigine 50 mg 1 tab p.o. b.i.d. 6.  Levemir 10 units subcutaneous at bedtime.  7.  Metoprolol tartrate 25 mg 1 tab p.o. b.i.d. 8.  NovoLog sliding scale insulin.  9.  Protonix 40 daily.  10.  Phenobarbital 97.2 mg at bedtime.  11.  Dilantin 300 at bedtime.  12.  Simvastatin 40 at bedtime.  13.  Flomax 0.4 daily.   SOCIAL HISTORY:  The patient is bed-bound and lives in a skilled nursing facility, does not smoke or drink.   FAMILY HISTORY:  Positive for diabetes.   REVIEW OF SYSTEMS:  Unobtainable due to the patient not able to answer much questions due to his chronic mental status.   PHYSICAL EXAMINATION:  VITAL SIGNS:  Temperature 96.7, pulse 82, respirations 20, blood pressure 146/80, O2 97%.  GENERAL:  A very chronically ill-appearing male, morbidly obese, currently not in any acute distress.  HEENT:  Head atraumatic, normocephalic. Pupils equally round, reactive to  light and accommodation. There is no conjunctival pallor. No scleral icterus. Nasal exam shows no drainage or ulceration.  OROPHARYNX:  Clear without any exudate.  NECK:  Supple without any JVD.  CARDIOVASCULAR:  Regular rate and rhythm. No murmurs, rubs, clicks or gallops. PMI is not displaced.  LUNGS:  Clear to auscultation bilaterally without any rales, rhonchi, wheezing.  ABDOMEN:  Soft, nontender, nondistended. Positive bowel sounds x 4. No hepatosplenomegaly.  EXTREMITIES:  No clubbing, cyanosis or edema.  SKIN:  No rash.  LYMPHATICS:  No lymph nodes palpable.  VASCULAR:  Good DP, PT pulses.   PSYCHIATRIC:  Not anxious or depressed. NEUROLOGIC:  A little lethargic, spontaneously moving all extremities. Limited exam.  LYMPH NODES:  Nonpalpable. Has a nephrostomy tube with bloody, red drainage.   LABORATORY, DIAGNOSTIC AND RADIOLOGICAL DATA:  In the ED glucose 221, BUN 36, creatinine 2.52, sodium 135, potassium 3.3, chloride 121, CO2 is 28, calcium is 7.9. LFTs:  Total protein 7.0, albumin 1.7, bili total 0.2, alk phos is 149, AST 22, ALT 16. Troponin less than 0.02. WBC 22.9, hemoglobin 10.9, platelet count 279.   Chest x-ray shows a stable airspace opacity at the left lung base favoring atelectasis given the prior abdominal CT, cardiomegaly.   ASSESSMENT AND PLAN:  The patient is a 80 year old African American male, was recently hospitalized with sepsis due to ESBL urinary tract infection, left-sided hydronephrosis with a ureteral stent status post nephrostomy tube, presents with hypoglycemia and lethargy.  1.  Hypoglycemia likely due to possible infection and poor p.o. intake as well as concurrent insulin use. At this time, his blood sugar is elevated after receiving some D/50. We will follow his blood sugars, hold insulin. Monitor his sugars.  2.  Possible sepsis, likely source urinary. At this time, I will place him on IV Invanz and IV Vanco. Obtain urine cultures. We will repeat ultrasound of the kidneys. Based on the results may need further urological input again.  3.  Hypertension. Continue metoprolol as taking.  4.  Seizure disorder. Continue Dilantin, phenobarbital. I will check the levels of both of these due to his lethargy.  5.  Dysphagia. Continue dysphagia 1 puree, honey-thick diet.  6.  Chronic renal failure. Continue to monitor his renal function.   CODE STATUS:  The patient was DO NOT RESUSCITATE as previously, which we will continue. He has a yellow documentation with a DO NOT RESUSCITATE status.   TIME SPENT:  50 minutes spent on this patient.    ____________________________ Lafonda Mosses. Posey Pronto, MD shp:jm D: 09/18/2013 11:35:42 ET T: 09/18/2013 11:53:51 ET JOB#: [id number]  cc: Zeeva Courser H. Posey Pronto, MD, <Dictator> Alric Seton MD ELECTRONICALLY SIGNED 09/21/2013 14:30

## 2014-12-10 NOTE — Consult Note (Signed)
General Aspect Jason Terrell is a 80yo African American male w/ PMHx s/f PVD, DM2, HTN, HLD, morbid obesity, COPD and h/o cellulitis/nonhealing foot ulcers. Cardiac history is unknown at this time based on records. Patient is altered, no family present. Cardiology consulted for elevated troponin in the setting of sepsis.   Prior notes indicate an aortogram performed in 2012 for bilateral nonhealing foot ulcers. PVD at the tibial levels appreciated, but not severe enough to warrant revascularization.   He had apparently was treated for aspiration pneumonia recently. He is an ALF resident. Niece is his POA. He became less responsive at the facility and was transported to the ED.   Present Illness There, SBP was noted to be in the 50s. This did respond to fluid resuscitation, but remained low. He was subsequently started on pressors. EKG, while limited somewhat by motion artifact, showed diffusely blunted T waves, no ST changes, borderline LAD. Initial TnI 0.90. CBC- WBC 28K. Bun 90/Cr 6.09 (baseline 1.1). Mg 2.8. Albumin 1.8. H/H 10.6/32.9. U/a- 3+ LE, 2+ blood. He was admitted by medicine, started on broad spectrum antibiotics, continued on pressors. TnI trend 0.09->0.11. Mg WNL. Ca 7.7. BUN 8-/Cr 5.11 today. ATN from UTI/septic shock suspected. No indication for HD. Blood cx NGTD x 2. CXR today- R IJ in place, cardiomegaly, increased vascular congestion.   He is currently altered and unable to provide any meaningful history.   PAST MEDICAL HISTORY:  1.  Group B streptococcal sepsis.   2.  Right lower extremity cellulitis.  3.  Metabolic encephalopathy.  4.  Questionable hepatic encephalopathy.  5.  Aspiration pneumonitis.  6.  Hypotension. 7.  Acute renal failure.  8.  Seizure disorder.  9.  Hypertension.  10.  Diabetes.  11.  BPH. 12.  Anxiety.  13.  Depression.  14.  Morbid obesity,   PAST SURGICAL HISTORY: (Dictation Anomaly)<<MISSING TEXT>> .   ALLERGIES: None.   Physical Exam:  GEN  obese, critically ill appearing   HEENT pale conjunctivae, poor dentition   NECK supple  No masses  trachea midline  unable to appreciate JVP due to redundant neck tissue & body habitus   RESP clear BS  mildly increased respiratory effort, stridor, no wheezing, rales or rhonchi   CARD Tachycardic  Normal, S1, S2  No murmur   ABD distended  hypoactive BS   EXTR negative cyanosis/clubbing, negative edema   SKIN tight to palpation   NEURO negative rigidity, positive tremor   PSYCH agitated   Review of Systems:  Subjective/Chief Complaint unable to provide ROS   ROS Pt not able to provide ROS   Home Medications: Medication Instructions Status  albuterol-ipratropium 2.5 mg-0.5 mg/3 mL inhalation solution 3 milliliter(s) inhaled 4 times a day for 7 days for congestion Active  cefTRIAXone 1 g injectable powder for injection 1 gram(s) intramuscular once a day for 5 days for aspiration pneumonia Active  clindamycin 300 mg oral capsule 1 cap(s) orally 3 times a day for 10 days for aspiration pneumonia Active  acetaminophen 650 mg rectal suppository 1 suppository(ies) rectal every 4 hours, As Needed - for Fever Active  Align 4 mg oral capsule 1 cap(s) orally once a day for prevention Active  allopurinol 100 mg oral tablet 1 tab(s) orally once a day Active  Amitiza 24 mcg oral capsule 1 cap(s) orally 2 times a day for constipation Active  Ativan 2 mg/mL injectable solution 1 milliliter(s) intramuscular every 6 hours, As Needed for reizure lasting longer than 1 minute  Active  Certagen Senior 1 tab(s) orally once a day for supplement Active  Dermacloud ointment Apply topically to buttocks 3 times a day (every shift) for redness Active  Dilantin 100 mg oral capsule, extended release 3 cap(s) (300 mg) orally once a day on Tuesday and Thursday Active  DULoxetine 60 mg oral delayed release capsule 1 cap(s) orally once a day Active  DuoNeb 2.5 mg-0.5 mg/3 mL inhalation solution 3 milliliter(s)  inhaled every 6 hours, As Needed for cough/congestion Active  Klor-Con 20 mEq oral tablet, extended release 1 tab(s) orally once a day Active  lactulose 10 g/15 mL oral syrup 15 milliliter(s) orally 2 times a day for constipation Active  LaMICtal ODT 50 mg oral tablet, disintegrating 1 tab(s) orally 2 times a day Active  Levemir FlexPen 100 units/mL subcutaneous solution 65 unit(s) subcutaneous once a day (in the morning) Active  losartan 50 mg oral tablet 2 tab(s) (100 mg) orally once a day Active  lovastatin 20 mg oral tablet 2 tab(s) (40 mg) orally once a day (at bedtime) Active  metoprolol tartrate 25 mg oral tablet 1 tab(s) orally 2 times a day Active  Norco 325 mg-5 mg oral tablet 1 tab(s) orally every 6 hours, As Needed - for Pain Active  NovoLOG PenFill 100 units/mL subcutaneous solution 8 unit(s) subcutaneous 3 times a day (with meals) Active  Oyster Shell Calcium 500 1250 mg oral tablet 1 tab(s) orally 2 times a day Active  pantoprazole 40 mg oral delayed release tablet 1 tab(s) orally once a day (at bedtime) Active  PHENobarbital 32.4 mg oral tablet 3 tab(s) orally once a day (at bedtime) Active  phenytoin 100 mg oral capsule, extended release 4 cap(s) (400 mg) orally once a day (at bedtime) except on Tuesday and Thursday Active  promethazine 25 mg/mL injectable solution 1 milliliter(s) intramuscular every 6 hours, As Needed - for Nausea, Vomiting Active  tamsulosin 0.4 mg oral capsule 1 cap(s) orally once a day Active  torsemide 100 mg oral tablet 1 tab(s) orally once a day (on hold from 09/07/13-09/12/13) Active  Tylenol 325 mg oral tablet 2 tab(s) orally 3 times a day Active  Vitamin C 500 mg oral tablet 1 tab(s) orally 2 times a day for supplement Active   Lab Results:  Thyroid:  22-Jan-15 05:19   Thyroid Stimulating Hormone 2.28 (0.45-4.50 (International Unit)  ----------------------- Pregnant patients have  different reference  ranges for TSH:  - - - - - - - - - -   Pregnant, first trimetser:  0.36 - 2.50 uIU/mL)  Routine Chem:  22-Jan-15 05:19   BUN  80  Creatinine (comp)  5.11  Cardiac:  21-Jan-15 15:15   Troponin I  0.90 (0.00-0.05 0.05 ng/mL or less: NEGATIVE  Repeat testing in 3-6 hrs  if clinically indicated. >0.05 ng/mL: POTENTIAL  MYOCARDIAL INJURY. Repeat  testing in 3-6 hrs if  clinically indicated. NOTE: An increase or decrease  of 30% or more on serial  testing suggests a  clinically important change)  CPK-MB, Serum 0.8 (Result(s) reported on 08 Sep 2013 at 05:21PM.)    19:26   Troponin I  0.09 (0.00-0.05 0.05 ng/mL or less: NEGATIVE  Repeat testing in 3-6 hrs  if clinically indicated. >0.05 ng/mL: POTENTIAL  MYOCARDIAL INJURY. Repeat  testing in 3-6 hrs if  clinically indicated. NOTE: An increase or decrease  of 30% or more on serial  testing suggests a  clinically important change)  CPK-MB, Serum 3.2    23:16  Troponin I  0.11 (0.00-0.05 0.05 ng/mL or less: NEGATIVE  Repeat testing in 3-6 hrs  if clinically indicated. >0.05 ng/mL: POTENTIAL  MYOCARDIAL INJURY. Repeat  testing in 3-6 hrs if  clinically indicated. NOTE: An increase or decrease  of 30% or more on serial  testing suggests a  clinically important change)  CPK-MB, Serum  3.9 (Result(s) reported on 09 Sep 2013 at 12:16AM.)  Routine UA:  21-Jan-15 15:15   Color (UA) Amber  Clarity (UA) Cloudy  Glucose (UA) Negative  Bilirubin (UA) Negative  Ketones (UA) Trace  Specific Gravity (UA) 1.019  Blood (UA) 2+  pH (UA) 5.0  Protein (UA) 30 mg/dL  Nitrite (UA) Negative  Leukocyte Esterase (UA) 3+ (Result(s) reported on 08 Sep 2013 at 04:48PM.)  RBC (UA) 20 /HPF  WBC (UA) 108 /HPF  Bacteria (UA) 3+  Epithelial Cells (UA) <1 /HPF  WBC Clump (UA) PRESENT  Mucous (UA) PRESENT (Result(s) reported on 08 Sep 2013 at 04:48PM.)  Routine Hem:  21-Jan-15 15:15   WBC (CBC)  28.7  22-Jan-15 05:19   WBC (CBC)  22.7   EKG:  Interpretation NSR, blunted  TWs diffusely, no ST changes, borderline LAD   Rate 85    No Known Allergies:   Vital Signs/Nurse's Notes: **Vital Signs.:   22-Jan-15 08:00  Vital Signs Type Routine  Temperature Source oral  Pulse Pulse 102  Respirations Respirations 20  Systolic BP Systolic BP 155  Diastolic BP (mmHg) Diastolic BP (mmHg) 69  Mean BP 97  Pulse Ox % Pulse Ox % 98  Pulse Ox Activity Level  At rest  Oxygen Delivery 2L  Pulse Ox Heart Rate 102    Impression 80yo African American male w/ PMHx s/f PVD, DM2, HTN, HLD, morbid obesity, COPD and h/o cellulitis/nonhealing foot ulcers.   1. Septic shock Urosepsis w/ hematuria, pyuria. Recent aspiration PNA. Weaned off pressor support. Normotensive. Treated with broad spectrum antibiotics currently. WBC count trending down.  -- Management per primary team  2. Metabolic encephalopathy Per #1. Apparently more lucid at baseline per notes.  -- Management per primary team  3. Acute renal insufficiency Secondary to #1. Improved with IVFs. No indication for HD at present.  -- Nephrology managing  4. Elevated troponin Mild elevation and plateau in the setting of sepsis, shock, reduced troponin excretion w/ acute renal insufficiency. Remains critically ill, but improving. WBC count trending down. BP, respiratory drive independently supported. Still encephalopathic, however. EKG indicates no acute ischemic changes. Cardiomegaly apparent on CXR. Increased vascular congestion. Sinus tachycardia, HR 120s, compensatory from sepsis. Vascular congestion may be attributed to high output, recent aspiration PNA, however will check 2D echo. Requiring IVFs. High pretest probability for CAD- DM2, HTN, HLD, PVD, obesity.  -- Check 2D echo -- Monitor volume status -- Continue ASA, statin. Restart BB as BP tolerates.  -- Hold ARB -- Risk stratify with lipid panel, Hgb A1C -- Treat underlying infection, defer ischemic work-up. Low suspicion for primary ACS, would hold  heparin.  5. Hypertension Transiently required pressors. Normotensive w/ wean.  -- Continue to monitor, hold antihypertensives for now  6. Hyperlipidemia -- Check lipid panel -- Continue statin   Electronic Signatures for Addendum Section:  Lorine Bears (MD) (Signed Addendum 22-Jan-15 18:01)  The patient was seen and examined. Agree with the above. He is confused. Mildly elevated TnI is likely due to supply demand ischemia due to septic shock. Will get an echo.   Electronic Signatures: Gery Pray (PA-C)  (Signed 22-Jan-15  09:24)  Authored: General Aspect/Present Illness, History and Physical Exam, Review of System, Home Medications, Labs, EKG , Allergies, Vital Signs/Nurse's Notes, Impression/Plan Lorine Bears (MD)  (Signed 22-Jan-15 18:01)  Co-Signer: General Aspect/Present Illness, History and Physical Exam, Review of System, Home Medications, Labs, EKG , Allergies, Vital Signs/Nurse's Notes, Impression/Plan   Last Updated: 22-Jan-15 18:01 by Lorine Bears (MD)

## 2014-12-10 NOTE — H&P (Signed)
PATIENT NAME:  Jason Terrell, Jason Terrell MR#:  [id number] DATE OF BIRTH:  02-28-1935  DATE OF ADMISSION:  09/08/2013  ADDENDUM   PRIMARY CARE PHYSICIAN: Dr. Maryellen Pile.  The patient's family history is positive for diabetes. Also, the patient's healthcare power of attorney is the patient's niece. She wants the patient in DNR status, but wants every other treatment.    ____________________________ Shaune Pollack, MD qc:jcm D: 09/08/2013 17:06:32 ET T: 09/08/2013 18:30:38 ET JOB#: [id number]  cc: Shaune Pollack, MD, <Dictator> Shaune Pollack MD ELECTRONICALLY SIGNED 09/09/2013 10:27

## 2014-12-10 NOTE — Consult Note (Signed)
Chief Complaint:  Subjective/Chief Complaint Urology F/U Day #2 Meropenem Day #2 Left PCN  Pt sleeping, unintelligable when aroused, consistent with documented exam this AM by Dr. Posey Pronto.   VITAL SIGNS/ANCILLARY NOTES: **Vital Signs.:   24-Jan-15 08:00  Vital Signs Type Routine  Temperature Temperature (F) 97.6  Celsius 36.4  Temperature Source axillary  Pulse Pulse 94  Respirations Respirations 21  Systolic BP Systolic BP 785  Diastolic BP (mmHg) Diastolic BP (mmHg) 57  Mean BP 81  Pulse Ox % Pulse Ox % 100  Pulse Ox Activity Level  At rest  Oxygen Delivery 2L  Pulse Ox Heart Rate 94    09:00  Vital Signs Type Routine  Pulse Pulse 86  Respirations Respirations 20  Systolic BP Systolic BP 885  Diastolic BP (mmHg) Diastolic BP (mmHg) 52  Mean BP 68  Pulse Ox % Pulse Ox % 97  Pulse Ox Activity Level  At rest  Oxygen Delivery Room Air/ 21 %  Pulse Ox Heart Rate 86  *Intake and Output.:   Daily 24-Jan-15 07:00  Grand Totals Intake:  2477.3 Output:  1165    Net:  1312.3 25 Hr.:  1312.3  IV (Primary)      In:  2300  IV (Primary)      In:  111.3  IV (Secondary)      In:  66  Urine ml     Out:  1005  Other Output ml     Out:  160  Length of Stay Totals Intake:  7453.8 Output:  3225    Net:  4228.8    Shift 15:00  Grand Totals Intake:  206.8 Output:      Net:  206.8 24 Hr.:  206.8  IV (Primary)      In:  200  IV (Secondary)      In:  6.8  Length of Stay Totals Intake:  7660.6 Output:  3225    Net:  4435.6   Brief Assessment:  GEN well developed, obese, lethargic, but arousable   Respiratory normal resp effort   Lab Results: Routine Chem:  24-Jan-15 03:41   Result Comment LABS - This specimen was collected through an   - indwelling catheter or arterial line.  - A minimum of 35ms of blood was wasted prior    - to collecting the sample.  Interpret  - results with caution.  Result(s) reported on 11 Sep 2013 at 04:06AM.  Glucose, Serum  195  BUN  72   Creatinine (comp)  5.27  Sodium, Serum 140  Potassium, Serum 3.7  Chloride, Serum  112  CO2, Serum  18  Calcium (Total), Serum  7.2  Anion Gap 10  Osmolality (calc) 306  eGFR (African American)  11  eGFR (Non-African American)  10 (eGFR values <674mmin/1.73 m2 may be an indication of chronic kidney disease (CKD). Calculated eGFR is useful in patients with stable renal function. The eGFR calculation will not be reliable in acutely ill patients when serum creatinine is changing rapidly. It is not useful in  patients on dialysis. The eGFR calculation may not be applicable to patients at the low and high extremes of body sizes, pregnant women, and vegetarians.)  Magnesium, Serum 1.8 (1.8-2.4 THERAPEUTIC RANGE: 4-7 mg/dL TOXIC: > 10 mg/dL  -----------------------)  Routine Hem:  24-Jan-15 03:41   WBC (CBC)  35.5  RBC (CBC)  3.31  Hemoglobin (CBC)  9.7  Hematocrit (CBC)  30.2  Platelet Count (CBC)  132  MCV 92  MCH  29.3  MCHC 32.0  RDW  17.0  Neutrophil % 90.4  Lymphocyte % 3.9  Monocyte % 4.8  Eosinophil % 0.7  Basophil % 0.2  Neutrophil #  32.2  Lymphocyte # 1.4  Monocyte #  1.7  Eosinophil # 0.2  Basophil # 0.1   Assessment/Plan:  Invasive Device Daily Assessment of Necessity:  Does the patient currently have any of the following indwelling devices? foley   Indwelling Urinary Catheter continued, requirement due to   Reason to continue Indwelling Urinary Catheter for strict Intake/Output monitoring for hemodynamic instability   Assessment/Plan:  Assessment 1. Left Proximal Ureteral Calculus, Obstructing - now Day #1 s/p Left PCN 2. Left Hydro secondary to #1 3. UTI/Sepsis - improving s/p Left PCN placement (off Levophed) 4. ARI - due to #3, stable, non-oliguric   Plan 1. Agree with Meropenem 2. No further urologic intervention indicated until resolution of the sepsis. Appreciate excellent ICU care.   Electronic Signatures: Darcella Cheshire (MD)  (Signed  24-Jan-15 10:49)  Authored: Chief Complaint, VITAL SIGNS/ANCILLARY NOTES, Brief Assessment, Lab Results, Assessment/Plan   Last Updated: 24-Jan-15 10:49 by Darcella Cheshire (MD)

## 2014-12-10 NOTE — Consult Note (Signed)
Jason Terrell was seen on 1/26 and status has significantly improved. Will need to keep his nephrostomy tube indwelling until the stone has been treated. Follow-up with me in approximately 1-2 weeks after discharge and treatment will be scheduled at that time.  Electronic Signatures: Riki Altes (MD)  (Signed on 27-Jan-15 09:36)  Authored  Last Updated: 27-Jan-15 09:36 by Riki Altes (MD)

## 2014-12-10 NOTE — Consult Note (Signed)
Chief Complaint:  Subjective/Chief Complaint Urology F/U Day #3 Meropenem; Day #3 Left PCN  Pt sleeping, but arousable, follows commands.   VITAL SIGNS/ANCILLARY NOTES: **Vital Signs.:   25-Jan-15 08:00  Vital Signs Type Routine  Temperature Temperature (F) 98.1  Celsius 36.7  Temperature Source axillary  Pulse Pulse 90  Respirations Respirations 17  Systolic BP Systolic BP 626  Diastolic BP (mmHg) Diastolic BP (mmHg) 54  Mean BP 73  Pulse Ox % Pulse Ox % 98  Pulse Ox Activity Level  At rest  Oxygen Delivery Room Air/ 21 %  Pulse Ox Heart Rate 92  *Intake and Output.:   Daily 25-Jan-15 07:00  Grand Totals Intake:  2751.4 Output:  1640    Net:  1111.4 24 Hr.:  1111.4  IV (Primary)      In:  50  IV (Primary)      In:  2400  IV (Secondary)      In:  301.4  Urine ml     Out:  1050  Dialysis Fluid Removed (ml) ml     Out:  340  Other Output ml     Out:  250  Length of Stay Totals Intake:  10205.2 Output:  4865    Net:  5340.2   Brief Assessment:  GEN well developed, obese, lethargic, but arousable   Respiratory normal resp effort   Lab Results: Routine Chem:  25-Jan-15 04:54   Result Comment LABS - This specimen was collected through an   - indwelling catheter or arterial line.  - A minimum of 78ms of blood was wasted prior    - to collecting the sample.  Interpret  - results with caution.  Result(s) reported on 12 Sep 2013 at 05:09AM.  Glucose, Serum  172  BUN  66  Creatinine (comp)  4.52  Sodium, Serum 145  Potassium, Serum 3.5  Chloride, Serum  118  CO2, Serum  13  Calcium (Total), Serum  7.5  Anion Gap 14  Osmolality (calc) 312  eGFR (African American)  14  eGFR (Non-African American)  12 (eGFR values <63mmin/1.73 m2 may be an indication of chronic kidney disease (CKD). Calculated eGFR is useful in patients with stable renal function. The eGFR calculation will not be reliable in acutely ill patients when serum creatinine is changing rapidly. It  is not useful in  patients on dialysis. The eGFR calculation may not be applicable to patients at the low and high extremes of body sizes, pregnant women, and vegetarians.)  Routine Hem:  25-Jan-15 04:54   WBC (CBC)  19.1   Assessment/Plan:  Invasive Device Daily Assessment of Necessity:  Does the patient currently have any of the following indwelling devices? foley   Indwelling Urinary Catheter continued, requirement due to   Reason to continue Indwelling Urinary Catheter for strict Intake/Output monitoring for hemodynamic instability   Assessment/Plan:  Assessment 1. Left Proximal Ureteral Calculus, Obstructing - now Day #2 s/p Left PCN 2. Left Hydro secondary to #1 3. UTI/Sepsis - improving s/p Left PCN placement (off Levophed) 4. ARI - due to #3, improving, non-oliguric   Plan No new GU recommendations.  Appreciate excellent ICU care.  Dr. StBernardo Heaterill resume care tomorrow morning.   Electronic Signatures: KiDarcella CheshireMD)  (Signed 25-Jan-15 10:04)  Authored: Chief Complaint, VITAL SIGNS/ANCILLARY NOTES, Brief Assessment, Lab Results, Assessment/Plan   Last Updated: 25-Jan-15 10:04 by KiDarcella CheshireMD)

## 2014-12-10 NOTE — Discharge Summary (Signed)
PATIENT NAME:  Jason Terrell, Jason Terrell MR#:  [id number] DATE OF BIRTH:  [birth year]  DATE OF ADMISSION:  09/18/2013 DATE OF DISCHARGE:  09/22/2013  REASON FOR ADMISSION: Hypoglycemia and lethargy.   DISCHARGE DIAGNOSES: 1.  Hypoglycemia.  2.  Metabolic encephalopathy.  3.  Clostridium diff infection with diarrhea.  4.  Hypernatremia.  5.  Dehydration.  6.  Sepsis due to Clostridium diff.  7.  Hypertension.  8.  Seizure disorder.  9.  Dysphagia.  10.  Chronic renal insufficiency.  11.  Anemia of chronic disease.  12.  Type 2 insulin dependent diabetes.   DISPOSITION: Skilled nursing facility.   MEDICATIONS AT DISCHARGE: Duloxetine 60 mg once a day, metoprolol 25 mg twice daily, Flomax 0.4 mg daily, Tylenol as needed for pain or fever, aspirin 325 mg once a day, DuoNeb 2.5 mg/0.5 mg 4 times a day, lamotrigine 50 mg 2 times a day for seizures, NovoLog insulin sliding scale, Protonix 40 mg once a day, phenobarbital 97.2 mg daily, simvastatin 40 mg daily, phenytoin 300 mg daily on Tuesday and Thursday and 400 mg the rest of the days; Levemir FlexPen 5 units subcutaneously every morning, metronidazole 400 mg every 8 hours for the next 10 days, potassium chloride 20 mEq once a day, lactobacillus acidophilus 1 capsule twice daily.   DISCHARGE FOLLOWUP:   Dr. Toy Cookey in the next 1 to 2 weeks.    HOSPITAL COURSE: This is a 80 year old gentleman with history of being admitted recently with septic shock to the ICU on 01/21.  The patient was found to have E. coli, ESBL infection with a stone creating obstruction on the proximal ureter, for which the patient needed a nephrostomy tube. The case, at that moment, was discussed with Dr. Lonna Cobb, and the patient needs to follow up with Dr. Lonna Cobb in the next couple weeks for evaluation of removal of the nephrostomy tube and possible removal of the stone.   The patient fully treated for this infection with Invanz.  Actually, repeat culture of urine was negative at  this moment.  Although the patient came in with altered mental status, he had significant hypoglycemia with blood sugars down to 30s by the paramedics, and at the ER, they were reading in the 200s after getting treated.   The patient had significant increase in white blood cells, for which cultures were done. Urine culture was negative. Chest x-ray did not show any significant signs of infection. There is an airspace opacity in the left lung, which is secondary to atelectasis.   As far as his hypoglycemia, possibly secondary to sepsis and infection. The patient was given insulin even if he had changes in mental status, for which his blood sugar dropped.  As it is right now, his blood sugars have stable; actually, with some D5 due to free water deficit, and his blood sugars have been above 200. At this moment, it is safe to restart insulin sliding scale and Levemir 5 units once a day.   As far as his hypernatremia, it is secondary to dehydration. The patient is going to be dehydrated regardless due to his diet. He is not able to drink water, only honey-thick fluids. He will be clinically dehydrated. Long discussions with the family about this issue.  We can start giving him free water with sippy cup at the nursing home if he is not drinking anything else.  Continue to do hydration. I recommended the patient to look into hospice, as he is chronically ill, debilitated with multiple  medical problems. The family is still not ready to proceed with this type of approach.>>. He is a DNR, but I think that he will benefit from being DO NOT HOSPITALIZE as well. The family is still not comfortable doing that decision.   As far as his sepsis, it was secondary to C. diff. He had a recent infection with ESBL on his kidney stone, but now his urinalysis is showing some red blood cells, white blood cells, but negative cultures, so this infection seems to be controlled. The patient does not need to be on meropenem or Invanz  anymore.  He is going to be discharged on Flagyl for the next 10 days for treatment of C. diff.   As far as hypertension, the patient to continue metoprolol.   As far as seizure disorder, continue Dilantin and phenobarbital.   His dysphagia, we addressed this issue above.   Chronic kidney disease seems to be stable, back to a better baseline. The patient gets dehydrated really easily. Last time he was on the verge of dialysis, but improved.   Metabolic encephalopathy was secondary to sepsis, worsened by hypernatremia, dehydration and at the beginning by hypoglycemia.   His anemia of chronic disease seems to be stable. Continue to monitor.   I spent about 50 minutes with this discharge today.   Please arrange followup with Dr. Lonna Cobb for evaluation of kidney stone.   ____________________________ Felipa Furnace, MD rsg:dmm D: 09/22/2013 11:49:00 ET T: 09/22/2013 12:45:36 ET JOB#: [id number]  cc: Felipa Furnace, MD, <Dictator> Serita Sheller. Maryellen Pile, MD Verna Czech. Lonna Cobb, MD Regan Rakers Juanda Chance MD ELECTRONICALLY SIGNED 10/03/2013 23:17

## 2014-12-10 NOTE — H&P (Signed)
PATIENT NAME:  Jason Terrell, Jason Terrell MR#:  [id number] DATE OF BIRTH:  [birth year]  DATE OF ADMISSION:  09/08/2013  PRIMARY CARE PHYSICIAN:  Dr. Maryellen Pile.   REFERRING PHYSICIAN: Dr. York Cerise.    CHIEF COMPLAINT: Unresponsiveness today.   HISTORY OF PRESENT ILLNESS: A 80 year old African American male with multiple medical problems including hypertension, diabetes, seizure disorder, previous history of sepsis and cellulitis. He was sent from nursing home due to unresponsiveness today. The patient is confused, unable to provide any information.   According to the patient and the niece, who is the health power attorney, the patient was diagnosed with a UTI and aspiration pneumonia last week and was treated with antibiotics and today he was noticed to be unresponsive in the nursing home and sent to ED for further evaluation.   The patient's blood pressure was low at 50s, was treated with normal saline 2 liters, so far stable. Blood pressure is low at 80s. Dr. York Cerise just put a central line in.   The patient's WBC is 28.7. Creatinine 6.05, BUN 90.   PAST MEDICAL HISTORY:  1.  Group B streptococcal sepsis.   2.  Right lower extremity cellulitis.  3.  Metabolic encephalopathy.  4.  Questionable hepatic encephalopathy.  5.  Aspiration pneumonitis.  6.  Hypotension. 7.  Acute renal failure.  8.  Seizure disorder.  9.  Hypertension.  10.  Diabetes.  11.  BPH. 12.  Anxiety.  13.  Depression.  14.  Morbid obesity,   PAST SURGICAL HISTORY: Aortogram .   ALLERGIES: None.   HOME MEDICATIONS: The patient has 2 pages of home medications. Please refer to the medication reconciliation list.   REVIEW OF SYSTEMS: Unable to obtain at this time due to the patient's mental status.   PHYSICAL EXAMINATION:  VITAL SIGNS: Temperature 99. Blood pressure was 50s and now is 98/58. Oxygen saturation 99%, pulse 79.  GENERAL: Confused, unable to talk, critically ill looking.  HEENT: Pupils are round, equal and reactive  to light. No discharge from ears or nose. Dry oral mucosa.  CARDIOVASCULAR: S1, S2.  NECK: Supple. No JVD or carotid bruits. No lymphadenopathy. No thyromegaly. Central line in situ on the right side of the neck.  CARDIOVASCULAR: S1, S2, regular rate and rhythm. No murmurs or gallop. PULMONARY: Bilateral air entry. Weak breath sounds with wheezing but no crackles.   ABDOMEN: Obese, soft. Bowel sounds present. No obvious organomegaly.  EXTREMITIES: Bilateral leg trace edema. Bilateral ankle contractures. No clubbing or cyanosis. Difficult to estimate where the patient has pedal pulses.  SKIN: No rash or jaundice.  NEUROLOGIC: Confused. Unable to talk or communicate. Unable to examine at this time.   LABORATORY DATA:  1.  ABG showed pH 7.35, pCO2 of 40, pO2 of 76, beside FiO2 of 28.  2.  WBC 28.7, hemoglobin 10.6, platelets 161.  3.  Glucose 124, BUN 90, creatinine 6.05, sodium 138, potassium 105, bicarb 21.  4.  Albumin 1.8.  5.  Lipase 90.  6.  INR 1.3.  7.  EKG showed normal sinus rhythm at 85 BPM with a prolonged QT.   IMPRESSIONS:  1.  Septic shock.  2.  Acute renal failure.  3.  Urinary tract infection.  4.  Acute metabolic encephalopathy.  5.  Anemia.  6.  Diabetes.  7.  Aspiration pneumonia.  8.  Chronic obstructive pulmonary disease.  9.  Seizure disorder.  10.  History of hypertension.   PLAN OF TREATMENT:  1.  The patient will be  admitted to critical care unit. We will continue normal saline bolus. May need Levophed if necessary. If the patient's blood pressure is stable, we will change to 150 mL/hour.  2.  We will start Zosyn and vancomycin and follow her blood culture, urine culture, and CBC.  3.  For acute renal failure, we will continue normal saline IV and follow up BNP and nephrology consult.  4.  Hold the patient's hypertension medication.  5.  Continue seizure medication.  6.  Aspiration, seizure and fall precautions.  7.  Gastrointestinal and deep vein  thrombosis prophylaxes.   Discussed the patient's critical condition and plan of treatment with the patient and the niece with  health power of attorney.    TIME SPENT: About 68 minutes.   ____________________________ Shaune Pollack, MD qc:np D: 09/08/2013 16:58:43 ET T: 09/08/2013 18:06:27 ET JOB#: [id number]  cc: Shaune Pollack, MD, <Dictator> Shaune Pollack MD ELECTRONICALLY SIGNED 09/09/2013 10:34

## 2014-12-10 NOTE — Consult Note (Signed)
Admit Diagnosis:   SEPTIC SHOCK: Onset Date: 10-Sep-2013, Status: Active, Description: SEPTIC SHOCK    Multi-drug Resistant Organism (MDRO): Positive culture for ESBL organsim., 08-Sep-2013   Pneumonia:    Mood disorder:    Anxiety:    GERD:    DVT:    depressive disorder:    renal failure:    diabetes:    Hypertension:   Lab Results:  TDMs:  23-Jan-15 03:45   Dilantin, Serum 11.2 (Result(s) reported on 10 Sep 2013 at 04:32AM.)  Routine Micro:  21-Jan-15 15:15   Organism Name ESCHERICHIA COLI  Organism Quantity >100,000 CFU/ML  Nitrofurantoin Sensitivity R  Cefazolin Sensitivity R  Ampicillin Sensitivity R  Ceftriaxone Sensitivity R  Ciprofloxacin Sensitivity R  Gentamicin Sensitivity S  Imipenem Sensitivity S  Levofloxacin Sensitivity R  Trimethoprim/Sulfamethoxazole Sensitivty R  Ertapenem Sensitivity S  Cefoxitin Sensitivity I  Micro Text Report URINE CULTURE   ORGANISM 1                >100,000 CFU/ML ESCHERICHIA COLI   COMMENT                   ESBL (Extended Spectrum Beta Lactamase)   ANTIBIOTIC                    ORG#1     AMPICILLIN                    R         CEFAZOLIN          R         CEFOXITIN                     I         CEFTRIAXONE                   R         CIPROFLOXACIN                 R         ERTAPENEM                     S         GENTAMICIN                    S         IMIPENEM     S         LEVOFLOXACIN                  R         NITROFURANTOIN                R         ESBL                          POSITIVE  TRIMETHOPRIM/SULFAMETHOXAZOLE R  Micro Text Report BLOOD CULTURE   COMMENT                   NO GROWTH IN 18-24 HOURS   ANTIBIOTIC                       Micro Text Report BLOOD CULTURE   COMMENT  NO GROWTH IN 18-24 HOURS   ANTIBIOTIC                       Specimen Source INDWELLING CATHETER  Organism 1 >100,000 CFU/ML ESCHERICHIA COLI  Culture Comment ESBL (Extended Spectrum Beta Lactamase)   Culture Comment NO GROWTH IN 18-24 HOURS  Result(s) reported on 09 Sep 2013 at 03:00PM.  Culture Comment NO GROWTH IN 18-24 HOURS  Result(s) reported on 09 Sep 2013 at 03:00PM.  Routine Chem:  23-Jan-15 03:45   Glucose, Serum  357  BUN  77  Creatinine (comp)  5.39  Sodium, Serum 138  Potassium, Serum 3.8  Chloride, Serum  108  CO2, Serum  15  Calcium (Total), Serum  7.5  Anion Gap 15  Osmolality (calc) 313  eGFR (African American)  11  eGFR (Non-African American)  9 (eGFR values <65m/min/1.73 m2 may be an indication of chronic kidney disease (CKD). Calculated eGFR is useful in patients with stable renal function. The eGFR calculation will not be reliable in acutely ill patients when serum creatinine is changing rapidly. It is not useful in  patients on dialysis. The eGFR calculation may not be applicable to patients at the low and high extremes of body sizes, pregnant women, and vegetarians.)  Result Comment LABS - This specimen was collected through an   - indwelling catheter or arterial line.  - A minimum of 532m of blood was wasted prior    - to collecting the sample.  Interpret  - results with caution.  Result(s) reported on 10 Sep 2013 at 04:33AM.  Magnesium, Serum 2.2 (1.8-2.4 THERAPEUTIC RANGE: 4-7 mg/dL TOXIC: > 10 mg/dL  -----------------------)    12:13   Glucose, Serum  285  BUN  75  Creatinine (comp)  5.35  Sodium, Serum 140  Potassium, Serum  3.4  Chloride, Serum  111  CO2, Serum  19  Calcium (Total), Serum  7.4  Anion Gap 10  Osmolality (calc) 312  eGFR (African American)  11  eGFR (Non-African American)  10 (eGFR values <6061min/1.73 m2 may be an indication of chronic kidney disease (CKD). Calculated eGFR is useful in patients with stable renal function. The eGFR calculation will not be reliable in acutely ill patients when serum creatinine is changing rapidly. It is not useful in  patients on dialysis. The eGFR calculation may not be  applicable to patients at the low and high extremes of body sizes, pregnant women, and vegetarians.)  Result Comment LABS - This specimen was collected through an   - indwelling catheter or arterial line.  - A minimum of 5ml45mf blood was wasted prior    - to collecting the sample.  Interpret  - results with caution.  Result(s) reported on 10 Sep 2013 at 12:42PM.  Routine Hem:  23-Jan-15 03:45   WBC (CBC)  33.8  RBC (CBC)  3.43  Hemoglobin (CBC)  10.1  Hematocrit (CBC)  32.0  Platelet Count (CBC)  140  MCV 93  MCH 29.6  MCHC  31.7  RDW  17.0  Neutrophil % 88.8  Lymphocyte % 5.0  Monocyte % 6.0  Eosinophil % 0.1  Basophil % 0.1  Neutrophil #  30.0  Lymphocyte # 1.7  Monocyte #  2.0  Eosinophil # 0.0  Basophil # 0.0   Radiology Results:  Radiology Results: CT:    23-Jan-15 09:45, CT Abdomen and Pelvis Without Contrast  CT Abdomen and Pelvis Without Contrast  REASON FOR EXAM:    (  1) hydronephrosis obn the setting of sepsis eval for   retained infected stone.;  COMMENTS:       PROCEDURE: CT  - CT ABDOMEN AND PELVIS W0  - Sep 10 2013  9:45AM     CLINICAL DATA:  Hydronephrosis in the setting of sepsis. Evaluate  for retained infected stone.    EXAM:  CT ABDOMEN AND PELVIS WITHOUT CONTRAST    TECHNIQUE:  Multidetector CT imaging of the abdomen and pelvis was performed  following the standard protocol without intravenous contrast.  COMPARISON:  Renal ultrasound 09/09/2013.    FINDINGS:  Bibasilar airspace disease is worse on the left. The heart size is  normal. No significant pleural or pericardial effusion is evident.    The liver and spleen are within normal limits. A small hiatal hernia  is noted. The stomach is mostly collapsed. The duodenum and pancreas  are unremarkable. The common bile duct and gallbladder are within  normal limits. There is slight thickening of the adrenal glands  bilaterally without a discrete nodule. The right kidney and ureter  are  within normal limits.    Moderate left-sided hydronephrosis is evident. An obstructing 9 mm  stone is present in the mid left ureter, at the level of L4. The  distal ureter is within normal limits. A Foley catheter is present  within the urinary bladder.    The rectosigmoid colon is mostly collapsed. There is some contrast  within the rectosigmoid colon. Portions the colon and multiple loops  of small bowel are noted immediately subjacent to the anterior wall  of the abdomen, suggesting adhesions. There is no evidence for  obstruction.    The a appendix is visualized and within normal limits. There is some  stranding about the retroperitoneum without a discrete mass.  Atherosclerotic calcifications are present in the aorta without  aneurysm.    A paraumbilical hernia is present, containing fat but no bowel. The  opening is 10 mm.    Degenerative anterolisthesis is present at L5-S1. Vertebral body  heights and alignment are otherwise maintained. No focal lytic or  blastic lesions are evident.     IMPRESSION:  1. Obstructing 9 mm stone in the mid left ureter with moderate  left-sided hydronephrosis.  2. No additional kidney stones are present on either side.  3. A Foley catheter is present within the urinary bladder.  4. Segments of colon and small bowel are noted immediately subjacent  to the ventral wall of the abdomen, suggesting adhesions. There is  no obstruction.  5. Atherosclerotic changes are present without aneurysm.  6. Bibasilar airspace disease, left greater than right. While this  may represent atelectasis, early infection is not excluded.  7. Small hiatal hernia.  8. Degenerative changes in the lower lumbar spine.      Electronically Signed    By: Jason Terrell M.D.    On: 09/10/2013 09:53         Verified By: Jason Terrell, M.D.,    No Known Allergies:   Nursing Flowsheets: **Vital Signs.:   23-Jan-15 12:00  Vital Signs Type Routine; Upon  transport  Temperature Temperature (F) 98.2  Celsius 36.7  Temperature Source oral  Pulse Pulse 80  Systolic BP Systolic BP 696  Diastolic BP (mmHg) Diastolic BP (mmHg) 60  Mean BP 84  Pulse Ox % Pulse Ox % 99  Pulse Ox Activity Level  At rest  Oxygen Delivery 2L; Nasal Cannula  Pulse Ox Heart Rate 80  Present Illness Jason Terrell is a 80 year old male admitted to the CCU on 09/08/2013 with septic shock, UTI and aspiration pneumonia.  He was diagnosed with UTI and aspiration pneumonia approximately one week ago and started on antibiotics.  The day of admission he was found to be unresponsive and transported to the emergency department.  He was found to be hypotensive and in acute renal failure.  He was seen by nephrology on 1/22 and a renal ultrasound was ordered which showed left hydronephrosis.  A noncontrast CT of the abdomen pelvis performed this morning was remarkable for left hydronephrosis secondary to a left proximal ureteral calculus. Since his admission his blood pressure have improved.  Creatinine this morning was 5.35.   Past urologic history remarkable for prostate cancer diagnosed in 2006.  He was initially on active surveillance and is presently on intermittent androgen ablation.   Case History and Physical Exam:  Family History Diabetes Mellitus   Neck/Nodes Supple   Abdomen Protuberant, soft, nontender   Genitalia Clear urine in Foley bag   Rectal Not examined   Skin Warm    Impression 1.  Left proximal ureteral calculus, obstructing 2.  Left hydronephrosis secondary to above 3.  UTI/septic shock   Plan He is septic and critically ill.  The best course of action would be placement of a percutaneous nephrostomy tube.  I have discussed this with the patient's family and interventional radiology and this will be performed today.  Discussed with Dr. Laurin Coder.   Electronic Signatures: Jason Terrell (MD)  (Signed 23-Jan-15 14:08)  Authored: Health Issues,  Significant Events - History, Home Medications, Labs, Radiology Results, Allergies, Vital Signs, General Aspect/Present Illness, History and Physical Exam, Impression/Plan   Last Updated: 23-Jan-15 14:08 by Jason Terrell (MD)

## 2014-12-10 NOTE — Discharge Summary (Signed)
PATIENT NAME:  Jason Terrell, Jason Terrell MR#:  [id number] DATE OF BIRTH:  June 26, 1935  DATE OF ADMISSION:  09/08/2013 DATE OF DISCHARGE:  09/15/2013  PRIMARY CARE PHYSICIAN:  Dr. Maryellen Pile at [facility].   CARDIOLOGY CONSULTATION:  With Odella Aquas, PA with [facility].   RENAL CONSULTATION:  With Dr. Cherylann Ratel and Dr. Thedore Mins.   UROLOGY CONSULTATION:  With Dr. Lonna Cobb.   CONDITION AT DISCHARGE:  Fair.    CODE STATUS:  NO CODE, DNR.    DIET:  1.  Dysphagia 1 pureed honey thick. No straws. Honey thick, Mighty Shakes t.i.d. at meals. Magic cup at lunch and dinner. Speech therapist to follow at the skilled facility.  2.  Please carry out aspiration precautions. The patient will need assistance to feed with each meals.  3.  Nephrostomy tube care.   FOLLOWUP: With Dr. Lonna Cobb as an outpatient for left proximal ureteral stone and left nephrostomy tube.   MEDICATIONS AT DISCHARGE: 1.  Metoprolol 25 mg b.i.d.  2.  DuoNebs q. 4 p.r.n.  3.  Tamsulosin 0.4 mg p.o. daily.  4.  Ertapenem injection  500 mg IM for 3 more days.  5.  Aspirin 325 mg p.o. daily.  6.  Sodium chloride 0.9% injection 10 mL by nephrostomy b.i.d.  7.  Detemir 10 units subcutaneous 24 hours.  8.  Simvastatin 40 mg at bedtime.  9.  Phenytoin sodium extended release 300 mg daily.  10.  Phenytoin sodium ER 400 mg at bedtime.  11.  Duloxetine 60 mg p.o. daily.  12.  Lamictal 50 mg b.i.d.  13.  Phenobarbital 97.2 mg oral at bedtime.  14.  Protonix 40 mg daily.  15.  Sliding scale insulin.   LABORATORY DATA: At discharge: Creatinine is 3.12, sodium is 146, chloride is 109, bicarbonate is 28. Calcium is 7.2. X-ray KUB shows right IJ catheter in good anatomic position. There is a left nephrostomy tube in good anatomic position. There is an 8 to 9 mm stone in the left proximal ureter at the L4-L5 disk level.  Similar finding noted on 09/10/2013 on the CT scan.   Platelet count is 197.   White count is down to 19,000.    Urine culture grew moderate growth of E. coli ESBL producing.   Echo of the heart showed left ventricular function ejection fraction is 55% to 60%, normal global left ventricular systolic function, trivial pericardial effusion, moderate aortic valve sclerosis without stenosis.   BRIEF SUMMARY OF HOSPITAL COURSE:  Jason Terrell is a 80 year old African American gentleman who is a resident at [facility] with multiple medical problems including history of chronic seizure disorder, mood and anxiety disorder, GERD, hypertension, history of DVT, depression, diabetes, comes into the Emergency Room with: 1.  Septic shock. The patient was admitted in the Intensive Care Unit and was started on IV fluids and IV Levophed was started as well. The source of sepsis was his severe left pyelonephritis, secondary to left proximal ureteral obstructive uropathy. The patient's symptoms improved. He was switched off Levophed and was moved out to the tele floor. He underwent left nephrostomy tube placement on 09/10/2013, by Urology. The patient's urine culture grew ESBL E. coli. He was on meropenem and we will change to IM ertapenem at the facility for 3 more days to complete a course. Dr. Lonna Cobb was following the patient from urology standpoint. A KUB showed persistent left proximal ureteral stone, which will be managed as outpatient by Dr. Lonna Cobb. The patient will be  discharged with a left nephrostomy tube with nephrostomy care per protocol at the nursing home.  2.  Acute renal failure secondary to ATN from septic shock and obstructive uropathy. The patient's creatinine on admission was 6.05. His baseline creatinine is 1.18 which was in April 2014. He was started on aggressive IV fluids and also received bicarbonate drip initially. The patient's creatinine came down to 3.16 prior to discharge and he was making good urine. Currently we have continued to hold his allopurinol, Lasix and losartan since his creatinine is  nicely trending down which could be resumed at the skilled facility.  3.  ESBL E. coli. Continue broad-spectrum antibiotics with IV ertapenem. He will finish a course of 10 to 12 days of remaining doses of IM ertapenem at the skilled facility.  4.  Metabolic encephalopathy due to sepsis. The patient's mentation improved. No seizures were noted. His seizure meds were resumed.  5.  Anemia of chronic disease, stable.  6.  Chronic seizure disorder. The patient is currently stable on present medications.  7.  Dysphagia, which appears to be on and off chronic with history of aspiration pneumonia. Speech therapy saw the patient and recommended above diet.  The patient was tolerating well. Chest x-ray did not show any evidence of aspiration pneumonia.  8.  Hypertension. Initially blood pressure meds were held secondary to septic shock; however, beta blockers were resumed prior to discharge. Continue to hold Lasix and losartan due to elevated creatinine.  9.  Type 2 diabetes. Detemir and sliding scale insulin. Hemoglobin A1c is 9.  10.  Benign prostatic hypertrophy. Continue tamsulosin.  11.  The patient is bedbound and wheelchair bound at baseline and hence did not meet any PT needs. The patient is from [facility]. He will be discharged over there. I have discussed the discharge planning with the patient's nieces who are healthcare power of attorney.  The patient does carry a long-term poor prognosis. They do understand that. The patient remained a no code, DO NOT RESUSCITATE in the hospital.   TIME SPENT: 40 minutes.    ____________________________ Wylie Hail Allena Katz, MD sap:dp D: 09/15/2013 09:54:57 ET T: 09/15/2013 10:13:39 ET JOB#: [id number]  cc: Adriene Knipfer A. Allena Katz, MD, <Dictator> Serita Sheller. Maryellen Pile, MD Lennox Pippins, MD Verna Czech. Lonna Cobb, MD Pincus Sanes. Arguello, PA-C Dr. Romualdo Bolk MD ELECTRONICALLY SIGNED 09/16/2013 16:07

## 2014-12-10 NOTE — Consult Note (Signed)
PATIENT NAME:  Jason Terrell, Jason Terrell MR#:  [id number] DATE OF BIRTH:  [birth year]  NEPHROLOGY CONSULTATION   DATE OF CONSULTATION:  09/09/2013  REFERRING PHYSICIAN:  Demetrios Loll, MD CONSULTING PHYSICIAN:  Tama High, MD  REASON FOR CONSULTATION: Acute renal failure.   HISTORY OF PRESENT ILLNESS: The patient is a 80 year old African-American male with past medical history of hypertension, diabetes mellitus, BPH, depression, anxiety, history of group B streptococcal sepsis, right lower extremity cellulitis, prior history of aspiration pneumonia and obesity, who was brought to [facility] with unresponsiveness. The patient normally resides at a nursing home. Unfortunately, at this time, the patient is unable to provide any history. It appears that he has had a recent outpatient urinary tract infection and the possibility of aspiration pneumonia. When the patient arrived here, his systolic blood pressure was 63. He was given 2 liter bolus in the Emergency Department and placed on pressors. He has currently been weaned off of the pressors. Blood pressure at present is 146/78. We are now asked to see him for evaluation and management of acute renal failure. His baseline creatinine is 1.1. Upon presentation, the creatinine was 6.05. This morning, creatinine is down to 5.11 with a BUN of 80. Serum bicarbonate is acceptable at 21. Foley catheter was placed. Thus far, urine output documented was 950 mL.   PAST MEDICAL HISTORY:  1. Hypertension.  2. Diabetes mellitus.  3. BPH.  4. Anxiety/depression.  5. Prior history of group B streptococcal sepsis.  6. History of right lower extremity cellulitis.  7. Seizure disorder.   ALLERGIES: No known drug allergies.   CURRENT INPATIENT MEDICATIONS: Include:  1. Normal saline 0.9 at 75 mL/h.  2. Norepinephrine drip.  3. Tylenol 650 mg q.4 hours p.r.n.  4. Aspirin 325 mg daily.  5. Azithromycin 500 mg IV q.24 hours.  6. Duloxetine 60 mg daily.   7. Heparin 5000 units subcutaneous q.8 hours.  8. Sliding scale insulin.  9. Lamictal 50 mg b.i.d.  10. Protonix 40 mg daily.  11. Phenobarbital 97.2 mg at bedtime.  12. Phenytoin 300 mg daily and 400 mg at bedtime.  13. Zosyn 3.375 grams IV q.12 hours.  14. Simvastatin 40 mg at bedtime.  15. Vancomycin 1000 mg IV q.48 hours.   SOCIAL HISTORY: Unable to obtain from the patient at this time as he is lethargic and confused. He resides at a nursing home.   FAMILY HISTORY: Unable to obtain directly from the patient at this time given his altered mental status; however, it appears that he has a family history of diabetes mellitus.   REVIEW OF SYSTEMS: Unable to obtain from the patient at this time as he has altered mental status.   PHYSICAL EXAMINATION:  VITAL SIGNS: Temperature 98.8, pulse 80, respirations 15, blood pressure 146/78, pulse oximetry 100% on 2 liters.  GENERAL: Reveals a disheveled-appearing African-American male who was unable to provide any history at this time.  HEENT: Normocephalic, atraumatic. The patient does have spontaneous extraocular movements. Oral mucosa slightly moist. Difficult to assess his hearing at present.  NECK: Supple, without JVD or lymphadenopathy.  LUNGS: Demonstrate scattered rhonchi bilaterally with slightly increased work of breathing.  CARDIOVASCULAR: S1, S2. The patient noted to be irregular. No murmurs or rubs appreciated.  ABDOMEN: Obese, soft, nontender, nondistended. Bowel sounds positive. No rebound or guarding. No gross organomegaly appreciated.  EXTREMITIES: No clubbing or cyanosis noted. Trace bilateral lower extremity edema noted.  NEUROLOGIC: The patient is lethargic, but is arousable. He is  not currently following commands.  MUSCULOSKELETAL: No joint redness, swelling or tenderness appreciated.  SKIN: Warm and dry. No rashes noted.  GENITOURINARY: Foley catheter noted to be in place. No suprapubic tenderness is noted at this time.   PSYCHIATRIC: Unable to fully assess at this time given his altered mental status.   LABORATORY DATA: Sodium 138, potassium 4.4, chloride 105, CO2 21, BUN 90, creatinine 6.05, glucose 124. More up-to-date BMP shows sodium 139, potassium 3.5, chloride 109, CO2 21, BUN 80, creatinine 5.11, glucose 154. Hemoglobin A1c 9.1. Total protein 6.5, albumin 1.8, total bilirubin 0.4, alkaline phosphatase 126, AST 22, ALT 17. Troponin of 0.11. TSH 2.28. CBC shows WBC is 22.7, hemoglobin 12.8, hematocrit 33, platelets 162. Urinalysis shows urine protein of 30 mg/dL, 20 RBCs per high-power field, 108 WBCs per high-power field. ABG shows pH 7.35, pCO2 40, pO2 of 76.   IMPRESSION: This is a 80 year old African-American male with past medical history of hypertension, diabetes mellitus, benign prostatic hypertrophy, anxiety/depression, prior history of group B streptococcal sepsis, history of right lower extremity cellulitis, seizure disorder, who presented to [facility] with altered mental status and found to have hypotension, pyuria and acute renal failure.   PROBLEM LIST:  1. Acute renal failure secondary to acute tubular necrosis.  2. Sepsis with suspected urinary source.  3. Altered mental status.  4. Malnutrition.  5. Diabetes mellitus.  6. Seizure disorder.   PLAN: The patient presented to [facility] with altered mental status. He appeared to have sepsis as his initial systolic blood pressure was 63. He was administered two 1 liter boluses. He was then transferred to the Critical Care Unit, where he was started on norepinephrine. His blood pressure has stabilized a bit now. His BUN and creatinine have also improved. We suspect that he has acute tubular necrosis as a cause of his acute renal failure. We suspect that his sepsis is from a urinary source as he has both hematuria and pyuria. We will proceed with further workup, including renal ultrasound, SPEP, UPEP, ANA,  ANCA antibodies, GBM antibodies, urine for eosinophils and urine protein to creatinine ratio. At present, there does not appear to be any acute need for dialysis. We would recommend continued monitoring of renal function as well as urine output. Agree with antibiotic selection for now to treat a wide variety of infections. Overall prognosis, however, appears to be guarded given his multiple medical problems and general condition. We would also recommend nutritionist evaluation given his low albumin.   I would like to thank Dr. Bridgett Larsson for this kind referral. Further plan as the patient progresses.   ____________________________ Tama High, MD mnl:lb D: 09/09/2013 07:37:29 ET T: 09/09/2013 07:54:45 ET JOB#: [id number]  cc: Tama High, MD, <Dictator> Mariah Milling Demar Shad MD ELECTRONICALLY SIGNED 09/23/2013 12:12

## 2014-12-11 NOTE — H&P (Signed)
PATIENT NAME:  Jason Terrell, Jason Terrell MR#:  [id number] DATE OF BIRTH:  10/26/1934  DATE OF ADMISSION:  08/19/2011  PRIMARY CARE PHYSICIAN:  Dr. Velvet Bathe.   CHIEF COMPLAINT: Fever, altered mental status.   HISTORY OF PRESENT ILLNESS: The patient is a 80 year old male with history of seizure disorder, anxiety, meningioma, who presents with chief complaint of confusion and fever. The patient did not have any seizures. He was brought to the Emergency Department. The patient was febrile. Temperature was 101 on arrival. Lumbar puncture could not be performed due to the patient's elevated INR. He has been empirically treated for meningitis. He received Rocephin 1 gram IV and Zithromax in the Emergency Room.   PAST MEDICAL HISTORY:    1. Morbid obesity.  2. Chronic left foot ulcer between great toe and first digit.  3. Chronic obstructive pulmonary disease. 4. Morbid obesity. 5. Hypertension.  6. Cellulitis of the left leg. 7. Renal disease.  8. Seizure disorder.  9. Hyperlipidemia.  10. Diabetes type 2.  11. Anxiety. 12. Depression.   PAST SURGICAL HISTORY: Aortogram.   ALLERGIES: No known drug allergies.   CURRENT MEDICATIONS:  1. Hydrocodone/acetaminophen 5/325 mg 1 p.o. q.8h.  2. Xanax 0.5 mg p.o. twice a day.  3. Lasix 100 mg p.o. daily.  4. Coumadin 5 mg p.o. daily. 5. Phenytoin 100 milligrams 4 capsules p.o. daily.  6. Tamsulosin 0.4 mg p.o. daily.  7. NovoLog 70/30 insulin, 32 units in the morning subcutaneous. 8. NovoLog 70/30 insulin 8 units in the evening.  9. Oystercal 500 mg one p.o. b.i.d.  10. Phenobarbital 30 mg p.o. 3 tablets daily. 11. Klor-Con 20 milliequivalents p.o. daily.  12. Lamotrigine 100 milligrams p.o. daily.  13. Lovastatin 40 mg p.o. daily.  14. Citalopram 5 mg p.o. daily.  15. Gabapentin 300 mg p.o. 3 times daily.   FAMILY HISTORY: Positive for diabetes.   SOCIAL HISTORY: The patient is a resident of a skilled nursing facility. No history of tobacco  abuse, alcohol abuse or drug abuse.   REVIEW OF SYSTEMS:  Limited due to the patient's mental status. CONSTITUTIONAL: Positive fevers. No chills or night sweats. HEENT: No dysphagia or hearing loss. RESPIRATORY: No cough, wheezing, or hemoptysis. GASTROINTESTINAL: No nausea, vomiting, abdominal pain, hematemesis, hematochezia, or melena. GU: No hematuria, dysuria, or frequency. NEUROLOGIC: No headache. No focal weakness. No seizures. MUSCULOSKELETAL: No arthritis, arthralgias, myalgias, joint effusion, or swelling. HEMATOLOGIC: No easy bleeding or bruises.   PHYSICAL EXAMINATION:  VITAL SIGNS: Temperature 98.9, heart rate 68, respiratory rate 20, blood pressure 101/72, oxygen saturation 98%.   HEENT: Atraumatic, normocephalic. Pupils are equal, round, and reactive to light and accommodation. Extraocular movements are intact. Sclerae anicteric. Mucous membranes dry.   NECK: Supple. No organomegaly.   CARDIOVASCULAR: S1, S2 is soft. No gallops. No thrills. No murmurs.   RESPIRATORY: Lungs are clear to auscultation. No rales or rhonchi. No wheezes. No bronchial breath sounds.   GI: Abdomen is soft, nontender, nondistended. Normal bowel sounds. No hepatosplenomegaly.   GU: There is no hematuria or masses noted.   SKIN: No lesions or rash.   ENDOCRINE: No masses. No thyromegaly.   LYMPH: No lymphadenopathy or nodes palpable.   NEUROLOGIC: Cranial nerves II through XII grossly intact. Motor strength is five out of five in bilateral upper and lower extremities. Sensation within normal limits. No focal neurological deficit noted on examination.   MUSCULOSKELETAL: No arthritis, joint effusion, or swelling.   HEMATOLOGICAL: No ecchymosis, no bleeding, and no petechiae noted.  EXTREMITIES: No cyanosis, no clubbing. Trace edema of bilateral lower extremities. There are chronic venous stasis changes present. There is a small ulceration in between the left great toe and first digit.   LABORATORY,  RADIOLOGICAL AND DIAGNOSTIC DATA: CT scan of the brain shows 9 mm brain density. CT of chest is negative. The patient's PT is 26.1, INR is 2.5. WBC count 16,200, hemoglobin 15.8, hematocrit 41.9, platelet count 213. Glucose 242, BUN 28, creatinine 1.1, sodium 139, potassium 4.5, chloride 96, CO2 32, calcium 8.7, total bilirubin 0.3, alkaline phosphatase 93, ALT 27, AST 40, total protein 7.5, albumin 3.1. Estimated GFR greater than 50. Troponin 12.1. Phenobarbital level 24..1. Urinalysis is normal.   ASSESSMENT AND PLAN:  1. The patient is a 80 year old male with history of seizures, anxiety, meningioma who presents with chief complaint of fever, altered mental status, leukocytosis consistent with acute meningitis. Admit to Critical Care Unit. Start the patient on IV Zosyn, Levaquin, Rocephin, vancomycin. MRI of brain. Neurology consultation.  2. Seizure disorder. Continue phenobarbital and Dilantin.  3. Hypertension. Continue Atenolol. 4. Diabetes. Accu-Cheks, insulin sliding scale, NovoLog.  5. Benign prostatic hypertrophy. Continue tamsulosin. 6. Anxiety. Continue Xanax. 7. Depression. Continue citalopram.  8. Hyperlipidemia. Continue Mevacor.  9. Coagulopathy. The patient given FFP. Will repeat PT-INR in the morning.   ____________________________ Donia Ast, MD jsp:ap D: 08/19/2011 05:22:51 ET T: 08/19/2011 09:28:45 ET JOB#: [id number]  cc: Donia Ast, MD, <Dictator> Dr. Velvet Bathe Donia Ast MD ELECTRONICALLY SIGNED 08/20/2011 2:22

## 2014-12-11 NOTE — Consult Note (Signed)
PATIENT NAME:  Jason Terrell, Jason Terrell MR#:  [id number] DATE OF BIRTH:  02/10/1935  DATE OF CONSULTATION:  08/19/2011  REFERRING PHYSICIAN:  Dr. Allena Katz CONSULTING PHYSICIAN:  Rose Phi. Kemper Durie, MD  HISTORY: Jason Terrell is a 80 year old right-handed African American bedbound greater than 5-year resident of [facility] and patient of Dr. Velvet Bathe with history of hypertension, hyperlipidemia, insulin-dependent adult onset diabetes mellitus, chronic renal insufficiency, morbid obesity, chronic left foot ulcer, 2007 admission for lower extremity cellulitis, Coumadin anticoagulation, anxiety and depression, left medial anterior occipital lobe meningioma, and history of treatment with Dilantin, phenobarbital, and Lamictal for seizure disorder. He was admitted early 08/19/2011 and is referred for evaluation of fever and altered mental status. History comes in part from the patient and primarily from his hospital records and the Critical Care Unit nurse and from telephone conversation with the caregiver at [facility].   The patient was brought to the Emergency Room very early on 08/19/2011 by EMTs summoned to [facility] with history the patient had been fine when checked at 11:07 p.m. on 08/18/2011 with the exception of a small amount of clear emesis. When he was checked at 11:55 p.m., he was found with evidence of emesis of undigested food. He was noted to be shaking continuously, to have wheezing of both lung fields, and of oxygen saturation on room air between 92 to 95%. He was noted to be able to talk. His temperature was 103.0. Blood pressure was 190/123.   In the Emergency Room, temperature was 101 and white blood count was elevated at 16,000. He was started on Rocephin and Zithromax. No lumbar puncture was performed because of elevations of prothrombin time and INR on Coumadin. Brain scan had showed no acute findings. The patient had no focal deficits on admission neurologic  examination. Dilantin level was 12.1, correctable to 18 as judged by albumin decreased at 2.8. Phenobarbital level was 24.   PHYSICAL EXAMINATION: The patient is an overweight elderly African American gentleman who is examined lying semisupine in Critical Care Unit with blood pressure 108/66 and heart rate 69. Oxygen saturation on nasal cannula oxygen was 100%. He was normocephalic without evidence of trauma. His neck was supple. There was no evidence of trauma to the tongue or lips. He was mildly lethargic with early interpretable mumbled edentulous speech. He was notable for intermittent tremors of the right upper extremity, reported by the patient to be experienced in the past before onset of seizure. Cranial nerve examination was normal with full visual field to finger count for each eye and normal eye movements and symmetric facial movement. Motor examination was symmetric with grossly normal power. Reflexes were diffusely decreased. The patient was able to answer questions and was grossly oriented to location and to time without exact date but correctly stated that it was a few days after Christmas.   IMPRESSION: His clinical picture appears most suggestive of having had an unwitnessed seizure with aspiration leading to elevation of white blood count and fever and to chills. At present, I have low suspicion of central nervous system infection.   RECOMMENDATIONS:  1. Continue Dilantin treatment, but at 400 mg rather than 300 mg at night.  2. Continue phenobarbital; I agree with change in hospital from 30 mg taking three a day to 100 mg one a day.  3. Continue Lamictal at 100 mg every morning, but also add 50 mg at night for a total of 150 mg a day.  4. Follow-up on result  of pending blood cultures.  5. Hold for now on consideration of reversing anticoagulation and performing lumbar puncture.   I appreciate being asked to see this pleasant and interesting gentleman.   ____________________________ Rose Phi. Kemper Durie, MD prc:slb D: 08/19/2011 13:51:16 ET T: 08/19/2011 14:35:36 ET JOB#: [id number]  cc: Rose Phi. Kemper Durie, MD, <Dictator> Gaspar Garbe MD ELECTRONICALLY SIGNED 08/22/2011 10:17

## 2014-12-11 NOTE — Consult Note (Signed)
PATIENT NAME:  Jason Terrell, Jason Terrell MR#:  [id number] DATE OF BIRTH:  30-Jan-1935  DATE OF CONSULTATION:  08/19/2011  REFERRING PHYSICIAN:  Katharina Caper, MD CONSULTING PHYSICIAN:  Rosalyn Gess. Konstantinos Cordoba, MD  REASON FOR CONSULTATION: Possible meningitis.   HISTORY OF PRESENT ILLNESS: The patient is a 80 year old black man with a past history significant for seizure disorder, meningioma, and diabetes who was admitted today with mental status changes. Nursing had talked to the nursing home from where he came and determined that he chews tobacco regularly but does not always sit up. He was chewing tobacco in a supine position and was noted to start choking and spitting up tobacco juice. He subsequently became more confused and developed fever. He was brought to the Emergency Room and his temperature was noted to be 101. Due to his mental status changes, he was started empirically on therapy for meningitis and is currently on vancomycin, ampicillin, and ceftriaxone. He was not started on steroids, however. Blood cultures have been obtained. The patient currently is breathing comfortably on 2 liters. He has not had any significant sputum production, per the Critical Care Unit nurses. He is awake and will follow commands, but he is unable to provide significant history. Per nursing this was his baseline at the nursing home.   ALLERGIES: No known drug allergies.   PAST MEDICAL HISTORY:  1. Diabetes.  2. Meningioma, it is unclear whether he has had this resected.  3. Seizure disorder.  4. Chronic obstructive pulmonary disease.  5. Hypertension.  6. Hypercholesterolemia.  7. Anxiety.  8. Depression.  9. Renal disease, but it is not clear what this entails.  10. The patient is on chronic Coumadin, but it is unclear what is being treated with this.   FAMILY HISTORY: Positive for diabetes.   SOCIAL HISTORY: The patient is the resident of a skilled nursing facility. He does not smoke nor does he drink. He does chew  tobacco, however.  REVIEW OF SYSTEMS: Unable to obtain from the patient due to his current neurologic condition.   PHYSICAL EXAMINATION:   VITAL SIGNS: T-max 99.4, T-current 98.9, pulse 68, blood pressure 108/66, and saturation 98% on 2 liters.   GENERAL: A 79 year old black man in no acute distress.   HEENT: Normocephalic, atraumatic. Pupils are equal and reactive to light. Extraocular motion is intact. Sclerae, conjunctivae, and lids are without evidence for emboli or petechiae. Oropharynx shows several teeth missing. No erythema or exudate. Gums are in fair condition.   NECK: Supple. Full range of motion. Midline trachea. No lymphadenopathy. No thyromegaly. Negative Kernig sign. Negative Brudzinski sign.   LUNGS: Relatively clear bilaterally with good air movement. No focal consolidation.   HEART: Regular rate and rhythm without murmur, rub, or gallop.   ABDOMEN: Soft, nontender, and nondistended. No hepatosplenomegaly. No hernia is noted.   EXTREMITIES: No evidence for tenosynovitis.   SKIN: No rashes. No stigmata of endocarditis, specifically no Janeway lesions or Osler nodes.   NEUROLOGIC: The patient was awake and made eye contact. He would move his hands and feet to command, but could not follow more complex commands. He did later following my examination ask the nurse what time it was and asked to see his family.   PSYCHIATRIC: Difficult to assess due to his lack of communication.  LABS/STUDIES: BUN 28, creatinine 1.18, bicarbonate 32, anion gap 11, AST 40, ALT 27, alkaline phosphatase 93, and total bilirubin 0.3. TSH 2.95. White count 16.2 on admission with hemoglobin of 13.8, platelet count of 213,  and ANC of 13.9.   Blood cultures from admission are pending.   A rapid influenza test was negative.   A urinalysis showed negative nitrites, negative leukocyte esterase, 6 red cells and 4 white cells per high-power field. Protein was greater than 500. Glucose was 50  mg/dL.  A chest x-ray from admission showed no acute infiltrates.   A CT scan of the head without contrast showed no acute intracranial process.  A CT scan of the chest without contrast showed no acute findings.  A MRI of the brain without contrast showed no acute intracranial findings.   IMPRESSION: A 80 year old black man with a history of seizure disorder and meningioma admitted with mental status changes and fever following probable aspiration.   RECOMMENDATIONS:  1. The history obtained from the home indicates that he likely aspirated. Most aspiration is chemical pneumonitis rather than infection. It can still be associated with fever and leukocytosis. His chest x-rays and CT show no infiltrate.  2. He is not very verbal, but this is apparently his usual state. He has no neck stiffness. This does not appear to be meningitis. I would not pursue lumbar puncture.  3. We will stop his antibiotics.  4. If his saturations drop, I would repeat his chest x-ray.  5. I would follow his CBC and fever curve.   Thank you very much for involving me in Mr. Vanstone's care. This is a moderately complex infectious disease case.  ____________________________ Rosalyn Gess. Mikeila Burgen, MD meb:slb D: 08/19/2011 13:59:17 ET     T: 08/19/2011 14:44:59 ET        JOB#: [id number] Nuchem Grattan E Anyela Napierkowski MD ELECTRONICALLY SIGNED 08/21/2011 8:57

## 2014-12-11 NOTE — Discharge Summary (Signed)
PATIENT NAME:  Jason Terrell, KEOWN MR#:  [id number] DATE OF BIRTH:  [birth year]  DATE OF ADMISSION:  08/19/2011 DATE OF DISCHARGE:  08/22/2011  ADMITTING DIAGNOSIS: Fever, possible sepsis, questionable meningitis.   DISCHARGE DIAGNOSES:  1. Group P Streptococcal sepsis likely due to right lower extremity cellulitis. 2. Metabolic encephalopathy likely due to sepsis. 3. Questionable hepatic encephalopathy. 4. Questionable postictal period. 5. Suspected aspiration pneumonitis and hypoxia due to aspiration pneumonitis, resolving. 6. Episode of hypotension with sepsis, resolved on intravenous fluids. 7. Acute renal failure due to hypotension, resolved. 8. History of seizure disorder. 9. History of hypertension. 10. Diabetes mellitus, insulin-dependent with hemoglobin A1c 8.8.  11. History of benign prostatic hypertrophy. 12. History of anxiety and depression. 13. History of hyperlipidemia with LDL of 61, total cholesterol 146, and HDL 63.  14. History of anxiety and depression. 15. Benign prostatic hypertrophy. 16. Hyperlipidemia. 17. Morbid obesity.  18. Questionable obstructive sleep apnea.  19. Lower extremity swelling of unclear etiology, possibly related to obstructive sleep apnea.  20. History of left foot chronic ulcer.  21. Chronic obstructive pulmonary disease.   DISCHARGE CONDITION: Stable.   DISCHARGE MEDICATIONS: The patient is to resume his outpatient medication which include the following.  1. Centrum one tablet once daily. 2. Xanax 0.25 mg every eight hours as needed.   ADDITIONAL MEDICATIONS:  1. Celexa 5 mg p.o. daily.  2. Mevacor 40 mg p.o. at bedtime.  3. NovoLog 32 units subcutaneously daily with breakfast and 8 units with supper.  4. Calcium with vitamin D 500 mg/200 units 1 tablet twice daily.  5. Flomax 0.5 mg p.o. daily.  6. Sliding scale insulin.  7. Phenobarbital 100 mg p.o. daily; this is a new dose.  8. Lamictal 50 mg p.o. at bedtime and 100 mg p.o. in the  morning; this is a new dose.  9. Dilantin 400 mg p.o. at bedtime; this is a new dose.  10. Warfarin 5 mg p.o. at 5:00 p.m. (The patient is to have pro time and INR checked in two days and follow recommendations for advancement of Coumadin therapy as necessary and following INR as necessary.) 11. Lasix 20 mg p.o. daily.  12. Amoxicillin 500 mg p.o. every eight hours or three times a day for 12 more days to complete 14 day course.   HOME OXYGEN: 1 liter of oxygen through nasal cannula continuous trying to wean the patient off oxygen keeping pulse oximetry at around 92%.   DIET: 1800 ADA, low fat, low sodium, mechanical soft diet.   PHYSICAL ACTIVITY LIMITATIONS: As tolerated.   REFERRALS: Physical therapy 2 to 7 times a week. The patient is to have pro time and INR checked in two days, as mentioned above.   DISCHARGE FOLLOWUP: Followup with Dr. Velvet Bathe in 2 days after discharge.  CONSULTANTS:  1. Orson Aloe, MD. 2. Care Management.  3. Suzan Slick, MD.  RADIOLOGIC STUDIES: Chest portable single view on 08/19/2011: No acute disease of the chest.   Portable single view repeated chest x-ray on 08/21/2011: Stable appearing chest as compared to exam on 08/19/2011. There is again noted nonspecific hazy increase in density of the left lung base with mild stable cardiac enlargement, according to the radiologist.   CT of the head without contrast on 08/19/2011 due to altered mental status: No acute intracranial process. However, chronic small vessel ischemic disease was noted.   CT of chest without contrast on 08/19/2011: No acute findings on noncontrast study.  MRI of brain without contrast on  08/19/2011: Examination limited by patient motion artifact. No definite acute intracranial findings were noted. Findings of chronic microangiopathy were noted.  HISTORY OF PRESENT ILLNESS: The patient is a 80 year old Caucasian male with past medical history significant for history of morbid  obesity, chronic obstructive pulmonary disease, and history of lower extremity swelling and cellulitis who was brought to the emergency room with fevers as well as altered mental status. Please refer to Dr. Eliane Decree admission on 08/19/2011.   On arrival to the emergency room, the patient's temperature was 101 on arrival and later was documented as 98.9, heart rate was 68, respiration rate 20, blood pressure 101/72, and saturation was 98% on oxygen therapy. Physical examination was unremarkable. He had edema in the bilateral lower extremities however and some ulcerations of his right lower extremity, anterior shin.   LABS/STUDIES: BUN was elevated to 28 and glucose 242, otherwise unremarkable BMP. The patient's liver enzymes showed an albumin level of 3.1 and slightly elevated AST to 40, otherwise unremarkable. Troponin was 0.05. TSH was normal at 2.95. Dilantin level was 12.1. Phenobarbital level was 24.1. Urine drug screen was positive for barbiturates. White blood cell count was elevated to 16.2, hemoglobin 13.8, platelets 213, and absolute neutrophil count was 13.9, which was elevated. The patient's pro time was 26.1 and INR was 2.5 on arrival.   The patient's blood cultures were taken on 08/19/2011 and they grew Streptococcus agalactia group B Streptococcus in anaerobic as well as aerobic bottles, 4 out of 4.   Urine cultures were negative.   HOSPITAL COURSE: The patient was admitted to the hospital with diagnosis of fever, sepsis, and possible meningitis. Consultation with Dr. Leavy Cella as well as neurologist, Dr. Kemper Durie, was obtained. Dr. Leavy Cella initially felt that the patient may have had reported aspiration followed by fevers and altered mental status. However, when blood cultures came back positive for Streptococcus infection, he recommended to discontinue vancomycin and start him on antibiotic, amoxicillin, to cover his streptococcus. The patient is to continue antibiotic therapy for 14 complete  days which means 12 more days of antibiotic coverage on oral antibiotic. He did not know exactly the source of the patient's infection, however, because the patient's right lower extremity looked red and hot during my evaluation, I feel that the patient in fact had right lower extremity cellulitis as culprit of his bacterial seeding. The patient had altered mental status which improved when the patient's sepsis was treated. It was unclear, however, if the patient had any element of hepatic encephalopathy. His ammonia level was checked and was found to be slightly elevated to the level of 41, on 08/19/2011. The patient was given lactulose, however, even with that therapy he did not seem to be significantly improved, but overall his improvement was significant from his admission mental status changes. It was unclear if hepatic encephalopathy had anything to do with it or hyperammonemia had anything to do with the altered mental status. Dr. Kemper Durie, neurologist, also saw the patient in consultation and he was concerned that the patient may have had an unwitnessed seizure episode. He recommended to continue Dilantin treatment, however, he recommended at higher doses of Dilantin, at 400 mg rather than 300 mg at night. He also recommended to continue phenobarbital, however, he recommended to change instead of taking three tablets of 30 mg tablets take 100 mg once daily dose. He also recommended to continue Lamictal at 100 mg in the morning, however, he wanted to add 50 mg at bedtime for a total of 150  mg a day. He did not feel that the patient had central nervous system infection and for this reason he did not recommend to reverse anticoagulation and perform lumbar puncture. Unfortunately, before this was recommended, the patient's anticoagulation was reversed with transfusion of fresh frozen plasma as well as vitamin K and the patient's pro time dropped down to 15.0 on 08/19/2011.  INR was 1.2. However, as no further  recommendations were made and the patient was improving on conservative therapy, the patient did not get lumbar puncture. So Coumadin was restarted and the patient is to continue to advance his Coumadin as needed.   On day of discharge, 08/22/2011, the patient's pro time was 15.2 and INR was 1.2. It is recommended to follow the patient's pro time and INR in the next few days after discharge and address issue of advancing his Coumadin doses to load him if needed.   Regarding hypoxia, which was noted when the patient arrived, it was not clear why the patient had this hypoxia; however, it was suggested that the patient could have had mild aspiration. His CT scan as well as first chest x-ray done in the emergency room did not show significant changes. Repeated chest x-ray done on 08/21/2011 revealed mild nonspecific haziness in the left lung base which can be just atelectasis, however, the patient does have a history of chronic obstructive pulmonary disease and because he had some changes in his lungs on his chest x-ray the decision was made to continue the patient on oxygen therapy and wean him slowly off oxygen as needed keeping his pulse oximetry at around 92%.   The patient was noted to be hypotensive with sepsis initially. His blood pressure was around the 100s, but dropping down to as low as 87 on day of the admission, 08/19/2011. However with IV fluid administration, the patient's blood pressure improved. His blood pressure medications, which reportedly the patient was taking at home, were suspended when the patient's blood pressure improved, however, the patient's blood pressure still was in a reasonable range and did not require blood pressure medications. It is however recommended if the patient's blood pressure remains consistently elevated, the patient may benefit from blood pressure medications to be initiated on an outpatient basis.   In regards to diabetes mellitus, the patient's Hemoglobin A1c  was checked and was found to be 8.8. However, the patient did not receive any significant changes in his regimen. He was continued on the same insulin as previously given in the facility. It is recommended however to initiate the patient on Lantus possibly or Levemir and advance his diabetic therapy as necessary to hopefully get Hemoglobin A1c at around 7 and below.   For benign prostatic hypertrophy, the patient is to continue his usual doses of Flomax.   For history of anxiety and depression, the patient was initiated on Celexa at 5 mg p.o. daily dose and Xanax should be used only as necessary and not to worsen his altered mental status.   Because of his physical appearance, I was concerned that the patient could have obstructive sleep apnea and lower extremity swelling could have been related to chronic obstructive pulmonary disease as well as obstructive sleep apnea. The patient needs to be evaluated with sleep study as an outpatient for possible obstructive sleep apnea. He may benefit from CPAP as an outpatient.   The patient is being discharged in stable condition with the above-mentioned medications and follow-up.  His vital signs on day of discharge are stable with a temperature of 98.8, pulse 69, respirations 18, blood pressure 144/74, and saturation 98% on 1 liter of oxygen through nasal cannula at rest. His fasting blood glucose level today, on 08/22/2011, was high at 222.   TIME SPENT: 40 minutes.  ____________________________ Katharina Caper, MD rv:slb D: 08/22/2011 14:08:00 ET T: 08/22/2011 14:50:25 ET JOB#: [id number]  cc: Katharina Caper, MD, <Dictator> Velvet Bathe, MD Elyse Prevo Winona Legato MD ELECTRONICALLY SIGNED 09/16/2011 7:16

## 2015-10-14 IMAGING — US US INTRAOPERATIVE
1 series · 5 of 5 positions shown · non-contrast
Comparison: none

CLINICAL DATA: 78-year-old with sepsis and left hydronephrosis due
to a left ureter stone.

[Series 1: us intraoperative · 0.24mm/px · 5 of 5 slices shown]
[im 1/5]
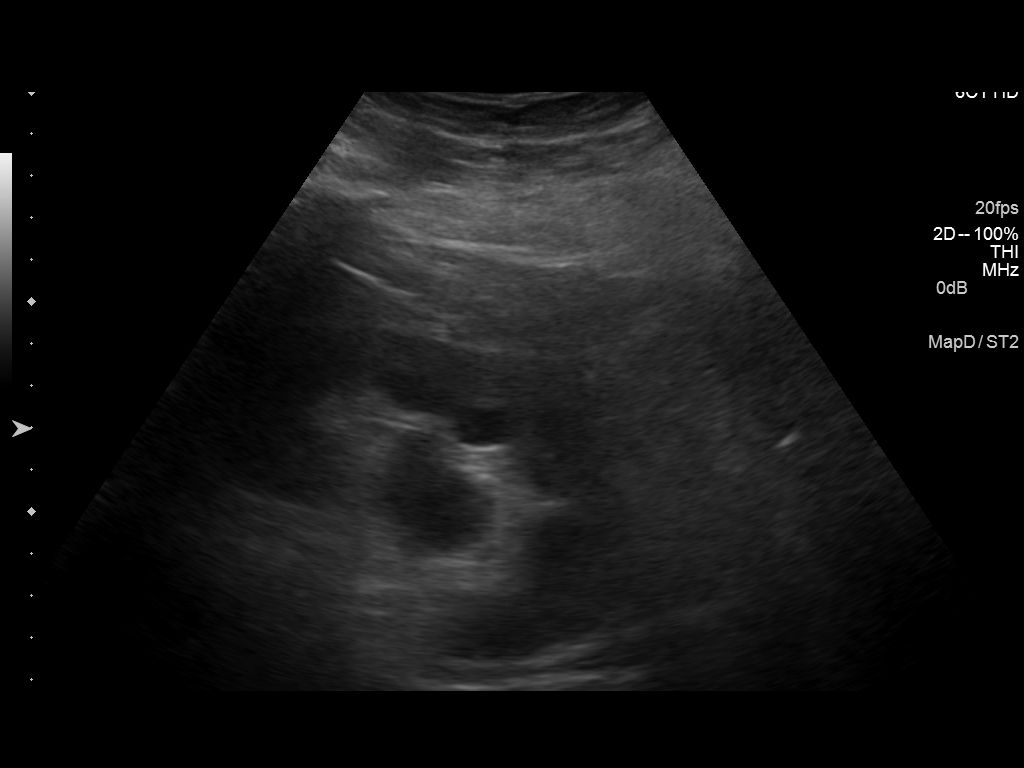
[im 2/5]
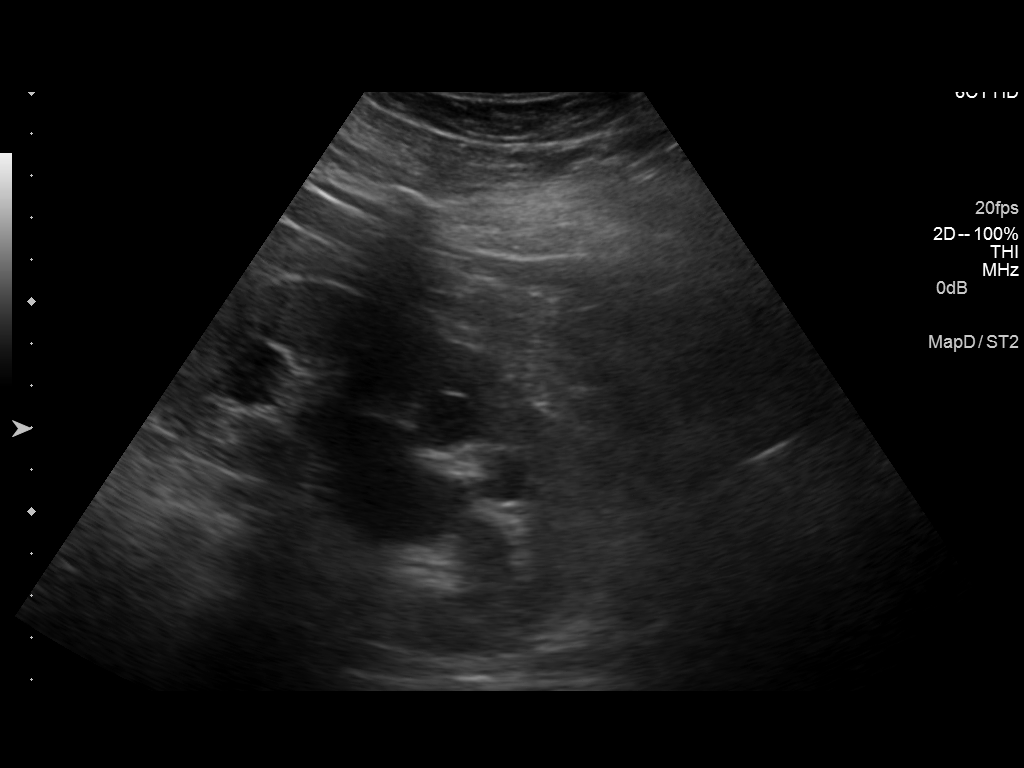
[im 3/5]
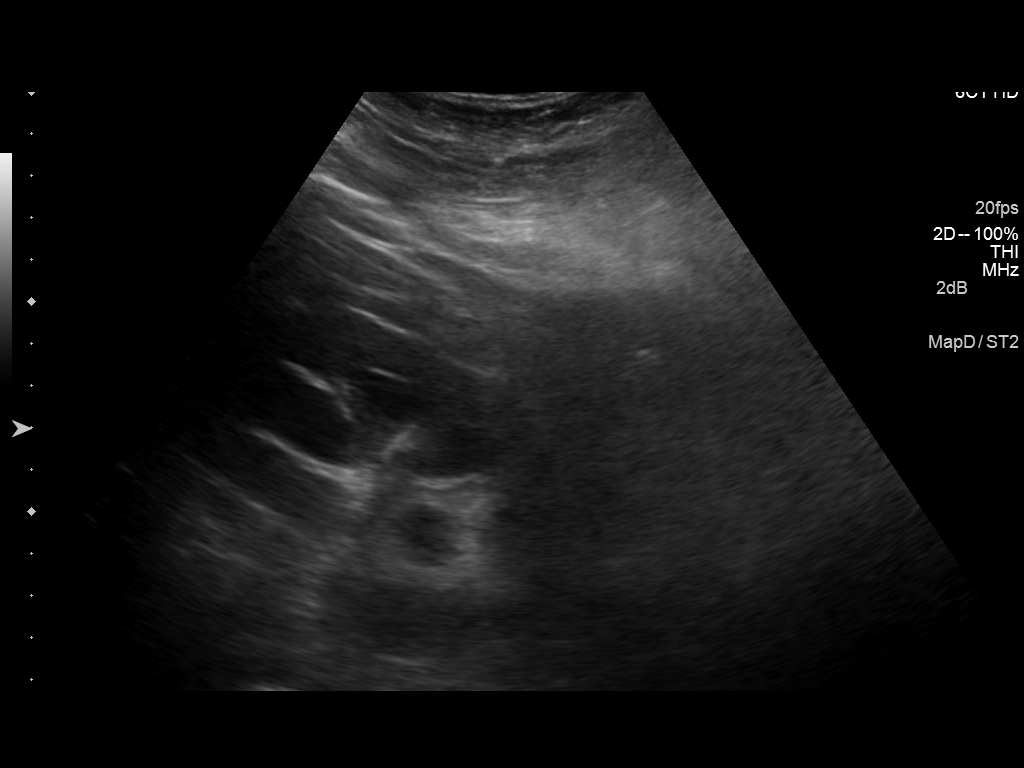
[im 4/5]
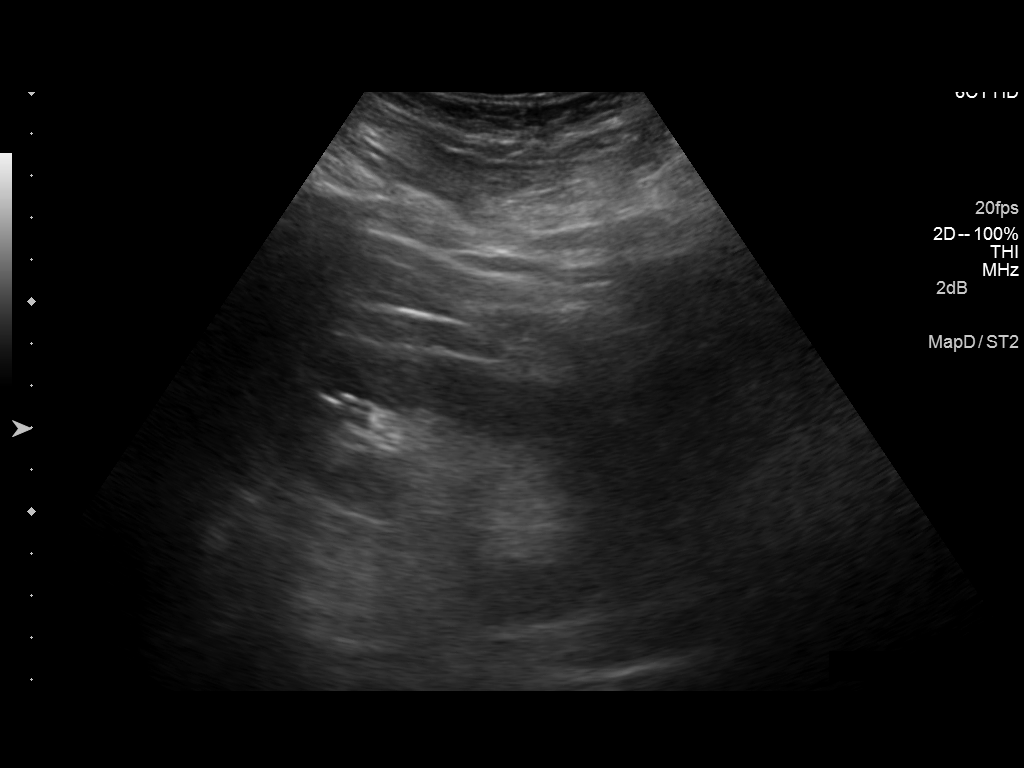
[im 5/5]
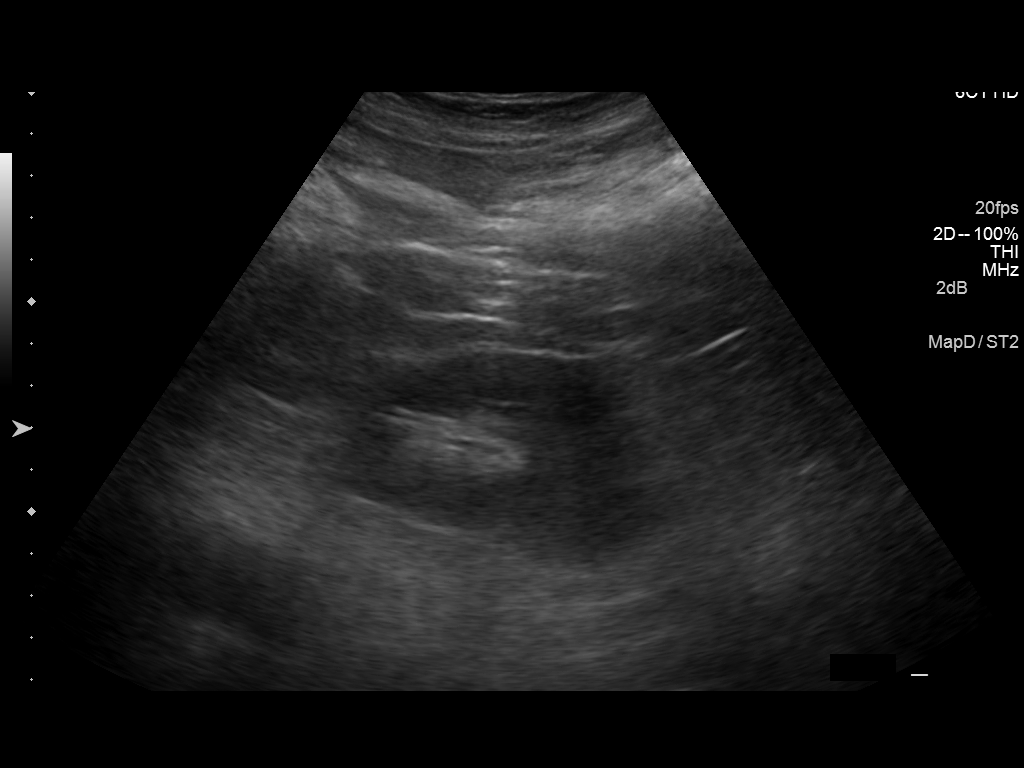

[5 of 5 positions shown; findings below may reference images not displayed]

EXAM:
LEFT PERCUTANEOUS NEPHROSTOMY TUBE PLACEMENT WITH ULTRASOUND AND
FLUOROSCOPIC GUIDANCE

MEDICATIONS:
Fentanyl 50 mcg

ANESTHESIA/SEDATION:
Fentanyl 50 mcg

FLUOROSCOPY TIME:  4 min and 12 seconds

PROCEDURE:
Informed consent was obtained from the patient's power of attorney.
Patient was brought into the interventional suite. Patient was
placed prone. Ultrasound demonstrated left hydronephrosis. The left
flank was prepped and draped in a sterile fashion. Maximal barrier
sterile technique was utilized including caps, mask, sterile gowns,
sterile gloves, sterile drape, hand hygiene and skin antiseptic.
Skin was anesthetized with 1% lidocaine. A 21 gauge needle was
directed towards a mid pole calyx. Small amount of contrast was
injected and confirmed placement in collecting system but the
puncture site was not ideal for nephrostomy tube placement. As
result, this needle was pulled back and the needle was directed
towards a mid pole calyx. Purulent fluid was slowly draining through
the needle hub. Wire was easily advanced into the renal pelvis and
ureter. The tract was dilated with an Accustick dilator set. A J
wire was placed and a 10 French multipurpose drain was reconstituted
in the renal pelvis. 35 ml of bloody purulent fluid was removed. The
catheter was flushed with a small amount of normal saline. Catheter
was sutured to the skin and attached to a gravity bag. Fluoroscopic
and ultrasound images were taken and saved for documentation.

COMPLICATIONS:
None
FINDINGS: Moderate left hydronephrosis. 35 ml of purulent fluid was drained
from the left kidney. Findings are consistent with left
pyonephrosis.
IMPRESSION: Successful left percutaneous nephrostomy tube placement with
ultrasound and fluoroscopic guidance.

Left pyonephrosis. 35 ml of purulent fluid was removed. Fluid was
sent for Gram stain and culture.

## 2015-10-22 IMAGING — US US RENAL KIDNEY
1 series · 14 of 25 positions shown · non-contrast
Comparison: Dx CT abdomen and pelvis 09/10/2013. Urinary tract
ultrasound 09/09/2013.

CLINICAL DATA: Sepsis. Obstructing left ureteral calculus on recent
imaging, with subsequent placement of a left percutaneous
nephrostomy.

EXAM:
RENAL/URINARY TRACT ULTRASOUND COMPLETE

[Series 1: us renal kidney · 0.28mm/px · 14 of 37 slices shown]
[im 1/37]
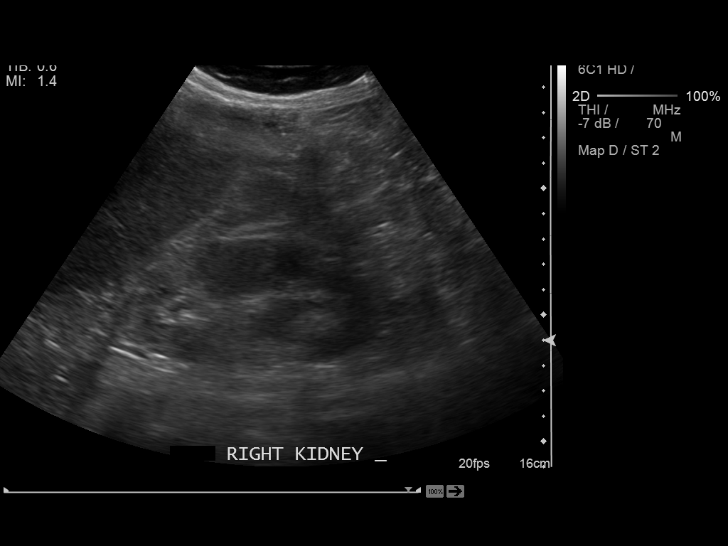
[im 4/37]
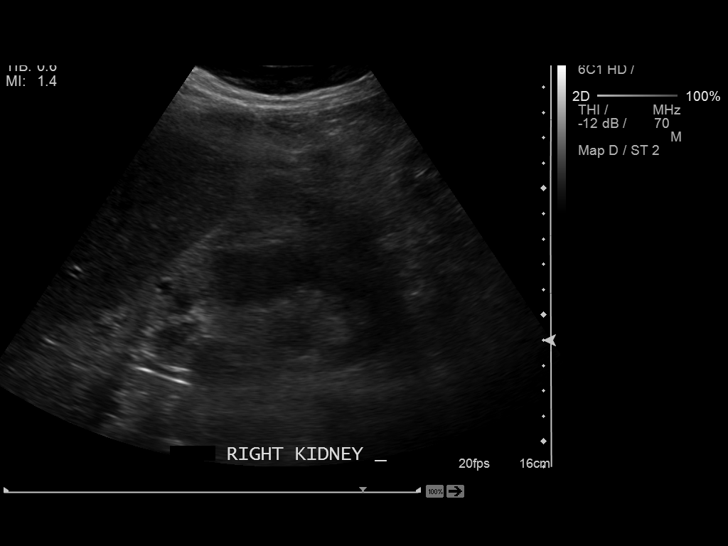
[im 7/37]
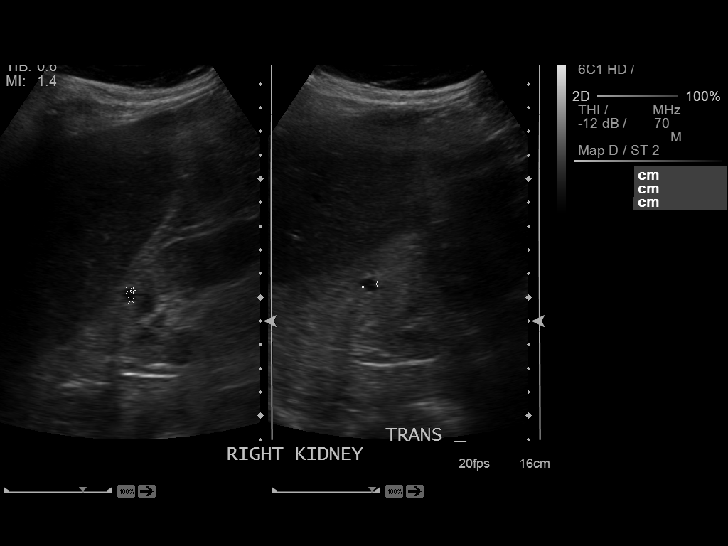
[im 10/37]
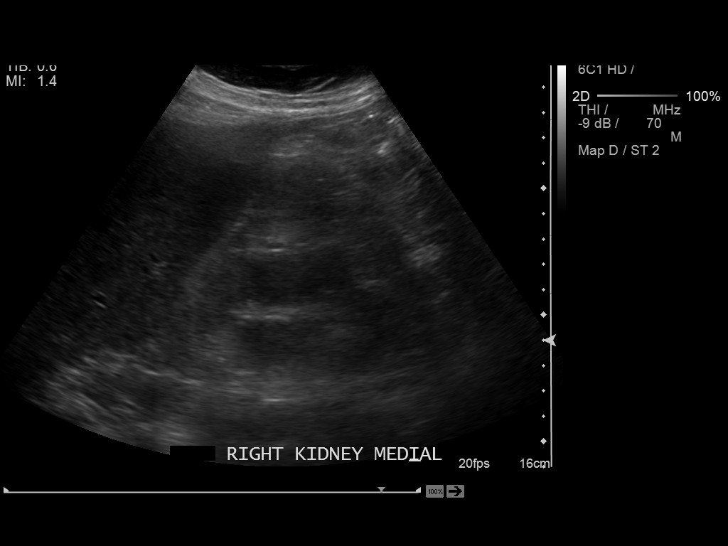
[im 13/37]
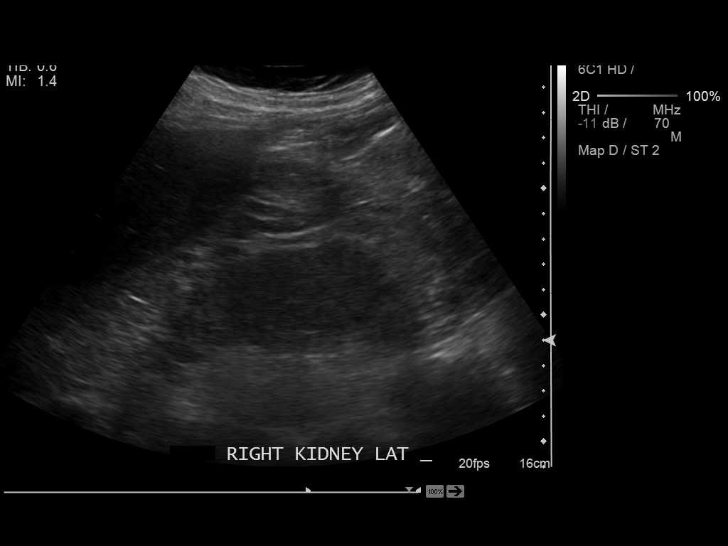
[im 14/37]
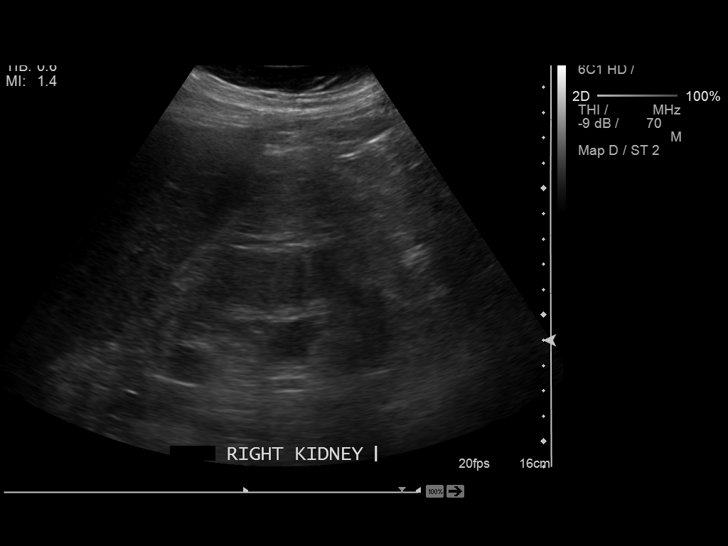
[im 17/37]
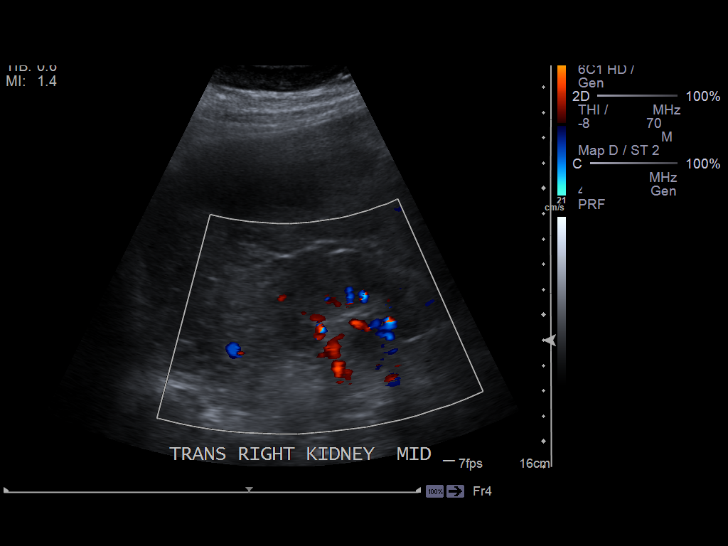
[im 20/37]
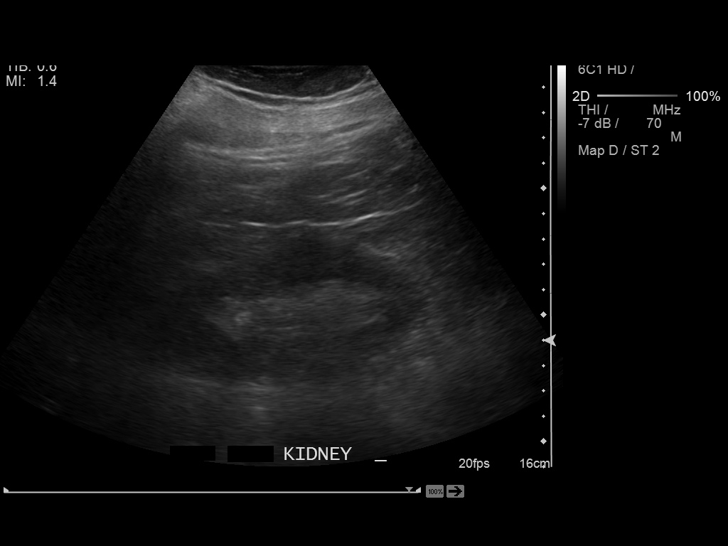
[im 23/37]
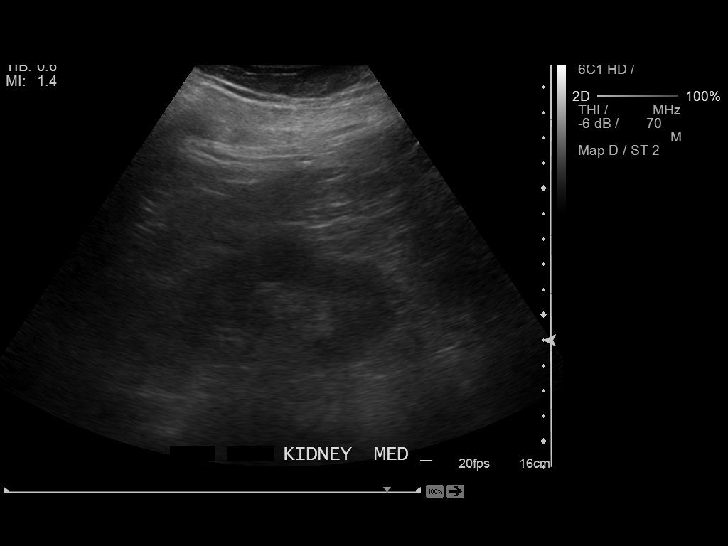
[im 25/37]
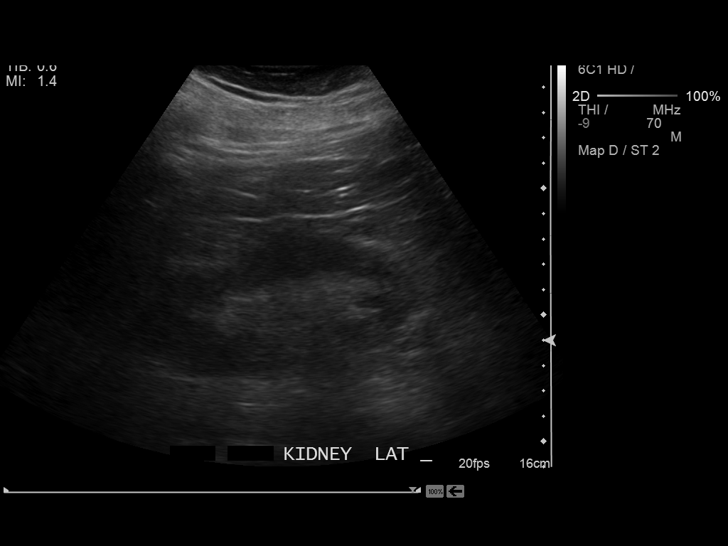
[im 28/37]
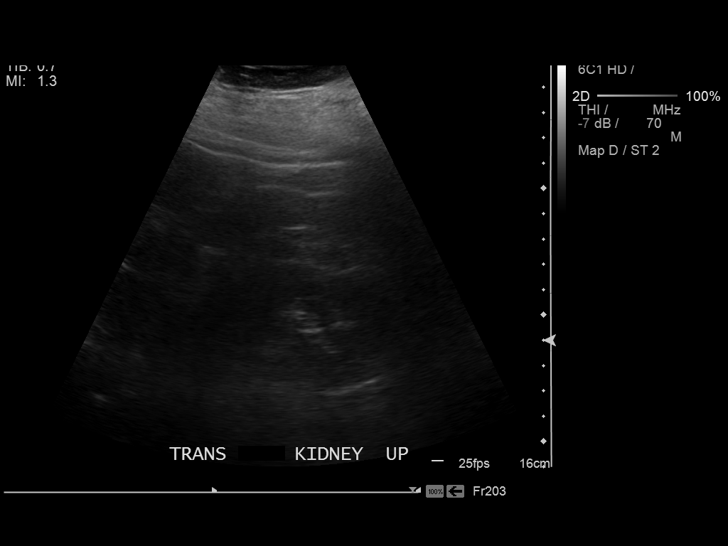
[im 31/37]
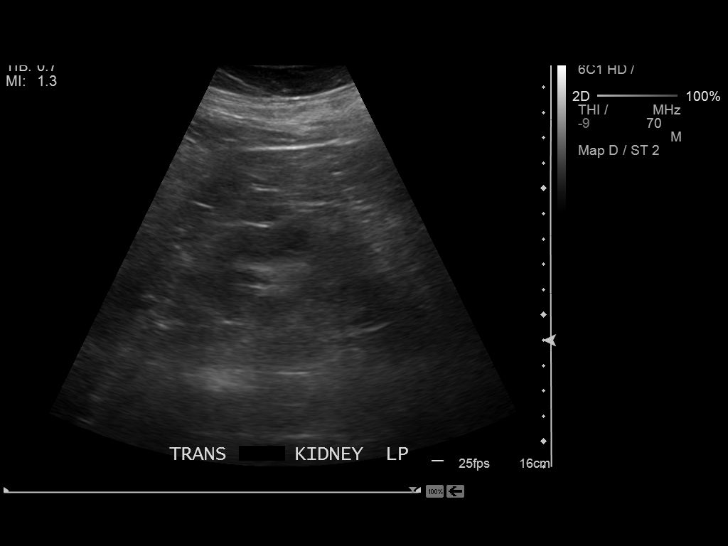
[im 34/37]
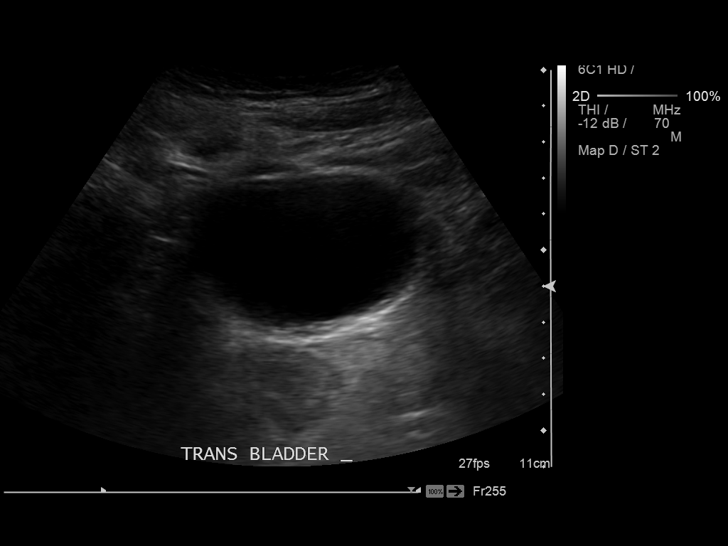
[im 37/37]
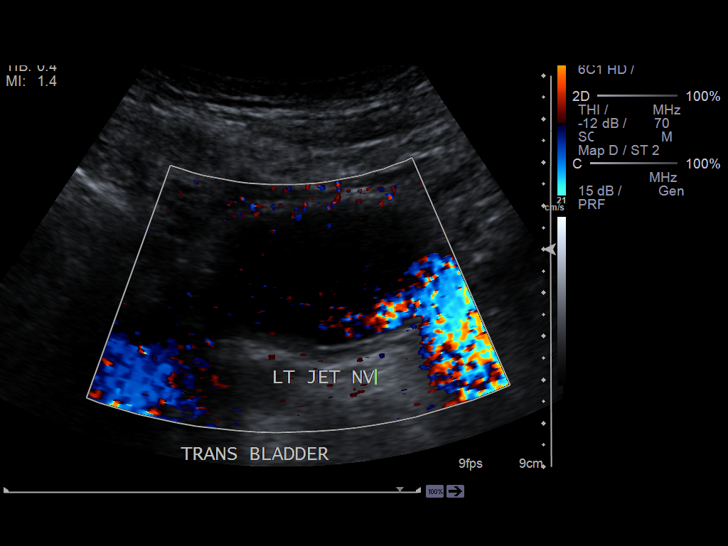

[14 of 25 positions shown; findings below may reference images not displayed]

FINDINGS: Right Kidney:

Length: Approximately 10.4 cm. Increased parenchymal echotexture as
noted on the prior examination. Approximate 6 mm simple cyst arising
from the upper pole as noted previously. Small extrarenal pelvis as
noted on the prior CT. No evidence of hydronephrosis. No parenchymal
masses.

Left Kidney:

Length: Approximately 11.1 cm. Normal parenchymal echotexture. No
residual hydronephrosis. No focal parenchymal abnormality.

Bladder:

Normal in appearance. Bilateral ureteral jets identified on Doppler
evaluation.
IMPRESSION: 1. Resolution of left hydronephrosis since the prior examinations
just over 1 week ago.
2. Echogenic right kidney which may be due to chronic renal disease,
unchanged.
3. No significant abnormality otherwise.
